# Patient Record
Sex: Female | Born: 1980 | Hispanic: No | Marital: Single | Smoking: Former smoker
Health system: Southern US, Community
[De-identification: ages and names within clinical notes are randomized; demographics above are authoritative.]

## PROBLEM LIST (undated history)

## (undated) DIAGNOSIS — T4145XA Adverse effect of unspecified anesthetic, initial encounter: Secondary | ICD-10-CM

## (undated) DIAGNOSIS — R51 Headache: Secondary | ICD-10-CM

## (undated) DIAGNOSIS — Z789 Other specified health status: Secondary | ICD-10-CM

## (undated) DIAGNOSIS — T8859XA Other complications of anesthesia, initial encounter: Secondary | ICD-10-CM

## (undated) HISTORY — DX: Other specified health status: Z78.9

## (undated) HISTORY — PX: NO PAST SURGERIES: SHX2092

## (undated) HISTORY — PX: WISDOM TOOTH EXTRACTION: SHX21

---

## 2011-12-22 ENCOUNTER — Inpatient Hospital Stay (HOSPITAL_COMMUNITY)
Admission: AD | Admit: 2011-12-22 | Discharge: 2011-12-22 | Disposition: A | Discharge status: Home / Self Care | Payer: Self-pay | Source: Ambulatory Visit | Attending: Obstetrics & Gynecology | Admitting: Obstetrics & Gynecology

## 2011-12-22 ENCOUNTER — Encounter (HOSPITAL_COMMUNITY): Payer: Self-pay | Admitting: *Deleted

## 2011-12-22 DIAGNOSIS — B9689 Other specified bacterial agents as the cause of diseases classified elsewhere: Secondary | ICD-10-CM | POA: Insufficient documentation

## 2011-12-22 DIAGNOSIS — A499 Bacterial infection, unspecified: Secondary | ICD-10-CM

## 2011-12-22 DIAGNOSIS — N760 Acute vaginitis: Secondary | ICD-10-CM | POA: Insufficient documentation

## 2011-12-22 DIAGNOSIS — N949 Unspecified condition associated with female genital organs and menstrual cycle: Secondary | ICD-10-CM | POA: Insufficient documentation

## 2011-12-22 HISTORY — DX: Adverse effect of unspecified anesthetic, initial encounter: T41.45XA

## 2011-12-22 HISTORY — DX: Headache: R51

## 2011-12-22 HISTORY — DX: Other complications of anesthesia, initial encounter: T88.59XA

## 2011-12-22 LAB — WET PREP, GENITAL: Trich, Wet Prep: NONE SEEN

## 2011-12-22 LAB — URINALYSIS, ROUTINE W REFLEX MICROSCOPIC
Bilirubin Urine: NEGATIVE
Ketones, ur: NEGATIVE mg/dL
Leukocytes, UA: NEGATIVE
Nitrite: NEGATIVE
Protein, ur: NEGATIVE mg/dL
Urobilinogen, UA: 0.2 mg/dL (ref 0.0–1.0)

## 2011-12-22 MED ORDER — METRONIDAZOLE 500 MG PO TABS: 500.0000 mg | ORAL_TABLET | Freq: Two times a day (BID) | ORAL | Status: DC

## 2011-12-22 NOTE — MAU Note (Signed)
Patient states she has had sharp shooting pains in the vagina for a couple of months.

## 2011-12-22 NOTE — MAU Provider Note (Signed)
History    CSN: [id number]  Arrival date and time: 12/22/11 1229   First Provider Initiated Contact with Patient 12/22/11 1337     Chief Complaint  Patient presents with  . Vaginal Pain   HPI  The patient is a 31 y.o. AAF presenting with intermittent sharp pain in her vagino-rectal area. It occurs 3-4 times a week for the past 2 months. There is no identifiable trigger of the pain. Denies N/V, change in BM, fever, abdominal pain, dysuria, hematuria. She has one sexual partner and has a copper IUD in place for the past 3 years. She has irregular menses, LMP 11/17/11.    Past Medical History  Diagnosis Date  . Headache   . Depression     No meds. or  tx  . Complication of anesthesia     Epidurals did not work w/labor   Past Surgical History  Procedure Date  . Vaginal delivery     X 5   History reviewed. No pertinent family history.  History  Substance Use Topics  . Smoking status: Former Smoker    Quit date: 12/22/1999  . Smokeless tobacco: Never Used  . Alcohol Use: Yes     Comment: occas. glass of wine   Allergies: No Known Allergies  Prescriptions prior to admission  Medication Sig Dispense Refill  . Copper IUD 1 each by Intrauterine route continuous.       Review of Systems  Constitutional: Negative for fever.  Gastrointestinal: Negative for nausea, vomiting and abdominal pain.  Genitourinary: Negative for dysuria and hematuria.   Physical Exam   Blood pressure 105/65, pulse 86, temperature 98.1 F (36.7 C), temperature source Oral, resp. rate 16, height 5\' 7"  (1.702 m), weight 83.462 kg (184 lb), SpO2 98.00%.  Physical Exam  Constitutional: She appears well-developed and well-nourished.  Cardiovascular: Normal rate, regular rhythm, normal heart sounds and intact distal pulses.   GI: She exhibits no distension. There is no tenderness.  Genitourinary: Rectal exam shows no external hemorrhoid. Uterus is not enlarged and not tender. Cervix exhibits no motion  tenderness and no discharge. Right adnexum displays no tenderness. Left adnexum displays no tenderness. Vaginal discharge found.       IUD strings visualized   Vaginal discharge was positive for odor, copious amount white, thin  discharge Results for orders placed during the hospital encounter of 12/22/11 (from the past 24 hour(s))  URINALYSIS, ROUTINE W REFLEX MICROSCOPIC     Status: Normal   Collection Time   12/22/11  1:10 PM      Component Value Range   Color, Urine YELLOW  YELLOW   APPearance CLEAR  CLEAR   Specific Gravity, Urine 1.015  1.005 - 1.030   pH 8.0  5.0 - 8.0   Glucose, UA NEGATIVE  NEGATIVE mg/dL   Hgb urine dipstick NEGATIVE  NEGATIVE   Bilirubin Urine NEGATIVE  NEGATIVE   Ketones, ur NEGATIVE  NEGATIVE mg/dL   Protein, ur NEGATIVE  NEGATIVE mg/dL   Urobilinogen, UA 0.2  0.0 - 1.0 mg/dL   Nitrite NEGATIVE  NEGATIVE   Leukocytes, UA NEGATIVE  NEGATIVE  POCT PREGNANCY, URINE     Status: Normal   Collection Time   12/22/11  1:27 PM      Component Value Range   Preg Test, Ur NEGATIVE  NEGATIVE  WET PREP, GENITAL     Status: Abnormal   Collection Time   12/22/11  2:03 PM      Component Value Range  Yeast Wet Prep HPF POC NONE SEEN  NONE SEEN   Trich, Wet Prep NONE SEEN  NONE SEEN   Clue Cells Wet Prep HPF POC MODERATE (*) NONE SEEN   WBC, Wet Prep HPF POC MODERATE (*) NONE SEEN   MAU Course  Procedures GcChl swab sent to lab   MDM  Assessment and Plan  A: Bacterial vaginosis confirmed by wet prep  P: Treat with metronidazole 500 mg bid for 7 days. Instructed to avoid alcohol and sexual intercourse until treatment complete. F/U with PCP if symptoms do not resolve after treatment.    Corky Downs 12/22/2011, 1:51 PM   I have reviewed the HPI with the student, observed her exam, reviewed labs and plan of care and agree with her findings

## 2011-12-23 LAB — GC/CHLAMYDIA PROBE AMP, GENITAL
Chlamydia, DNA Probe: NEGATIVE
GC Probe Amp, Genital: NEGATIVE

## 2012-05-31 ENCOUNTER — Inpatient Hospital Stay (HOSPITAL_COMMUNITY)
Admission: AD | Admit: 2012-05-31 | Discharge: 2012-05-31 | Disposition: A | Discharge status: Home / Self Care | Payer: Self-pay | Source: Ambulatory Visit | Attending: Obstetrics & Gynecology | Admitting: Obstetrics & Gynecology

## 2012-05-31 ENCOUNTER — Encounter (HOSPITAL_COMMUNITY): Payer: Self-pay | Admitting: *Deleted

## 2012-05-31 DIAGNOSIS — A499 Bacterial infection, unspecified: Secondary | ICD-10-CM | POA: Insufficient documentation

## 2012-05-31 DIAGNOSIS — B9689 Other specified bacterial agents as the cause of diseases classified elsewhere: Secondary | ICD-10-CM | POA: Insufficient documentation

## 2012-05-31 DIAGNOSIS — Z30431 Encounter for routine checking of intrauterine contraceptive device: Secondary | ICD-10-CM | POA: Insufficient documentation

## 2012-05-31 DIAGNOSIS — N949 Unspecified condition associated with female genital organs and menstrual cycle: Secondary | ICD-10-CM | POA: Insufficient documentation

## 2012-05-31 DIAGNOSIS — R109 Unspecified abdominal pain: Secondary | ICD-10-CM | POA: Insufficient documentation

## 2012-05-31 DIAGNOSIS — N760 Acute vaginitis: Secondary | ICD-10-CM | POA: Insufficient documentation

## 2012-05-31 LAB — URINALYSIS, ROUTINE W REFLEX MICROSCOPIC
Bilirubin Urine: NEGATIVE
Leukocytes, UA: NEGATIVE
Nitrite: NEGATIVE
Specific Gravity, Urine: 1.015 (ref 1.005–1.030)
Urobilinogen, UA: 0.2 mg/dL (ref 0.0–1.0)
pH: 6 (ref 5.0–8.0)

## 2012-05-31 LAB — WET PREP, GENITAL

## 2012-05-31 LAB — URINE MICROSCOPIC-ADD ON

## 2012-05-31 MED ORDER — METRONIDAZOLE 500 MG PO TABS: 500.0000 mg | ORAL_TABLET | Freq: Three times a day (TID) | ORAL | Status: DC

## 2012-05-31 NOTE — MAU Note (Addendum)
I was in store and had sharp pain in lower abdomen. Lasted about . Was sharp pain. Hurt to sit and was hard to move. No bleeding or d/c. Pt states has IUD which was placed 61yrs ago in [place]

## 2012-05-31 NOTE — MAU Provider Note (Signed)
Attestation of Attending Supervision of Advanced Practitioner (PA/CNM/NP): Evaluation and management procedures were performed by the Advanced Practitioner under my supervision and collaboration.  I have reviewed the Advanced Practitioner's note and chart, and I agree with the management and plan.  Shatera Rennert, MD, FACOG Attending Obstetrician & Gynecologist Faculty Practice, [facility]  

## 2012-05-31 NOTE — MAU Provider Note (Signed)
CC: Abdominal Pain and Vaginal Pain    First Provider Initiated Contact with Patient 05/31/12 1755      HPI Rachael Jones is a 32 y.o. [id number]  who presents with report of an episode of sharp vaginal pain that felt like a contraction in her vagina at 11:00 today lasting 10 minutes. It has not recurred. She has not had intercourse for a week but when she did she had postcoital spotting about 8 days ago. She is in a mutually monogamous relationship. Has an IUD that has been in 3 years and she thinks her pain may be related to that. She had one similar episode of pain which was less severe occurring about 6 months ago. LMP 05/15/2012. Denies abnormal bleeding except as noted above.  ROS: Denies irritative vaginal discharge frequency or urgency of urination, hematuria, back pain, fever, nausea vomiting diarrhea  Past Medical History  Diagnosis Date  . Headache   . Complication of anesthesia     Epidurals did not work w/labor    OB History   Grav Para Term Preterm Abortions TAB SAB Ect Mult Living   5 5 5       5      # Outc Date GA Lbr Len/2nd Wgt Sex Del Anes PTL Lv   1 TRM            2 TRM            3 TRM            4 TRM            5 TRM               Past Surgical History  Procedure Laterality Date  . Vaginal delivery      X 5  . No past surgeries      History   Social History  . Marital Status: Single    Spouse Name: N/A    Number of Children: N/A  . Years of Education: N/A   Occupational History  . Not on file.   Social History Main Topics  . Smoking status: Former Smoker    Quit date: 12/22/1999  . Smokeless tobacco: Never Used  . Alcohol Use: Yes     Comment: occas. glass of wine  . Drug Use: No  . Sexually Active: Yes    Birth Control/ Protection: IUD   Other Topics Concern  . Not on file   Social History Narrative  . No narrative on file    No current facility-administered medications on file prior to encounter.   Current Outpatient  Prescriptions on File Prior to Encounter  Medication Sig Dispense Refill  . Copper IUD 1 each by Intrauterine route continuous.        No Known Allergies  PHYSICAL EXAM Filed Vitals:   05/31/12 1614  BP: 92/51  Pulse: 68  Temp: 98.1 F (36.7 C)  Resp: 18   General: Well nourished, well developed female in no acute distress Cardiovascular: Normal rate Respiratory: Normal effort Abdomen: Soft, nontender Back: No CVAT Extremities: No edema Neurologic: Alert and oriented Speculum exam: NEFG; no discomfort with insertion of speculum, vagina with gray frothy, no active blood; cervix friable with swabbing Bimanual exam: cervix closed, no CMT; uterus NSSP; no adnexal tenderness or masses  LAB RESULTS Results for orders placed during the hospital encounter of 05/31/12 (from the past 24 hour(s))  URINALYSIS, ROUTINE W REFLEX MICROSCOPIC     Status: Abnormal   Collection  Time    05/31/12  4:20 PM      Result Value Range   Color, Urine YELLOW  YELLOW   APPearance CLEAR  CLEAR   Specific Gravity, Urine 1.015  1.005 - 1.030   pH 6.0  5.0 - 8.0   Glucose, UA NEGATIVE  NEGATIVE mg/dL   Hgb urine dipstick TRACE (*) NEGATIVE   Bilirubin Urine NEGATIVE  NEGATIVE   Ketones, ur NEGATIVE  NEGATIVE mg/dL   Protein, ur NEGATIVE  NEGATIVE mg/dL   Urobilinogen, UA 0.2  0.0 - 1.0 mg/dL   Nitrite NEGATIVE  NEGATIVE   Leukocytes, UA NEGATIVE  NEGATIVE  URINE MICROSCOPIC-ADD ON     Status: None   Collection Time    05/31/12  4:20 PM      Result Value Range   Squamous Epithelial / LPF RARE  RARE   WBC, UA 0-2  <3 WBC/hpf   RBC / HPF 0-2  <3 RBC/hpf  POCT PREGNANCY, URINE     Status: None   Collection Time    05/31/12  4:42 PM      Result Value Range   Preg Test, Ur NEGATIVE  NEGATIVE  WET PREP, GENITAL     Status: Abnormal   Collection Time    05/31/12  6:10 PM      Result Value Range   Yeast Wet Prep HPF POC NONE SEEN  NONE SEEN   Trich, Wet Prep NONE SEEN  NONE SEEN   Clue Cells Wet  Prep HPF POC MODERATE (*) NONE SEEN   WBC, Wet Prep HPF POC FEW (*) NONE SEEN    ASSESSMENT  1. BV (bacterial vaginosis)     PLAN Discharge home. See AVS for patient education.    Medication List    TAKE these medications       Copper Iud  1 each by Intrauterine route continuous.     metroNIDAZOLE 500 MG tablet  Commonly known as:  FLAGYL  Take 1 tablet (500 mg total) by mouth 3 (three) times daily.       Follow-up Information   Follow up with [facility] In 2 weeks. (Someone from GYN clinic will call you with an appointment)    Contact information:   [address] [id number] [place] [address] [phone]       Danae Orleans, CNM 05/31/2012 6:07 PM

## 2012-06-01 LAB — GC/CHLAMYDIA PROBE AMP: GC Probe RNA: NEGATIVE

## 2012-06-30 ENCOUNTER — Encounter: Payer: Self-pay | Admitting: Medical

## 2012-07-13 ENCOUNTER — Inpatient Hospital Stay (HOSPITAL_COMMUNITY)
Admission: AD | Admit: 2012-07-13 | Discharge: 2012-07-13 | Disposition: A | Discharge status: Home / Self Care | Payer: Self-pay | Source: Ambulatory Visit | Attending: Obstetrics & Gynecology | Admitting: Obstetrics & Gynecology

## 2012-07-13 ENCOUNTER — Other Ambulatory Visit: Payer: Self-pay

## 2012-07-13 ENCOUNTER — Encounter (HOSPITAL_COMMUNITY): Payer: Self-pay | Admitting: *Deleted

## 2012-07-13 ENCOUNTER — Inpatient Hospital Stay (HOSPITAL_COMMUNITY): Payer: Self-pay

## 2012-07-13 DIAGNOSIS — S29011A Strain of muscle and tendon of front wall of thorax, initial encounter: Secondary | ICD-10-CM

## 2012-07-13 DIAGNOSIS — R079 Chest pain, unspecified: Secondary | ICD-10-CM | POA: Insufficient documentation

## 2012-07-13 MED ORDER — IBUPROFEN 600 MG PO TABS: 600.0000 mg | ORAL_TABLET | Freq: Four times a day (QID) | ORAL | Status: DC | PRN

## 2012-07-13 NOTE — Progress Notes (Signed)
Patient states she drinks "5 hour energy". States she usually drinks 5 per week.

## 2012-07-13 NOTE — MAU Note (Signed)
Patient states that when she takes a deep breath, she has pain over the left breast in the chest wall. Pain has been there for a few weeks, comes and goes only with deep breaths.

## 2012-07-13 NOTE — MAU Provider Note (Signed)
History     CSN: [id number]  Arrival date and time: 07/13/12 1318   First Provider Initiated Contact with Patient 07/13/12 1409      Chief Complaint  Patient presents with  . Chest Pain   HPI Ms. Rachael Jones is a 32 y.o. [id number] presents to MAU today with chest pain on the upper left side x 2-3 weeks. The patient states that the pain comes and goes. It is most prominent on deep inspiration. The patient denies numbness, tingling or paralysis of her upper extremities. She denies abdominal pain, heartburn, dizziness, weakness, LOC, recent URI or allergic rhinitis symptoms, fever or SOB. She states some DOE, but this is not a new problem.   OB History   Grav Para Term Preterm Abortions TAB SAB Ect Mult Living   5 5 5       5       Past Medical History  Diagnosis Date  . Headache(784.0)   . Complication of anesthesia     Epidurals did not work w/labor    Past Surgical History  Procedure Laterality Date  . Vaginal delivery      X 5  . No past surgeries    . Wisdom tooth extraction      History reviewed. No pertinent family history.  History  Substance Use Topics  . Smoking status: Former Smoker    Quit date: 12/22/1999  . Smokeless tobacco: Never Used  . Alcohol Use: Yes     Comment: occas. glass of wine    Allergies: No Known Allergies  No prescriptions prior to admission    Review of Systems  Constitutional: Negative for fever and malaise/fatigue.  HENT: Negative for congestion and sore throat.   Respiratory: Negative for cough, sputum production, shortness of breath and wheezing.   Cardiovascular: Positive for chest pain. Negative for palpitations and leg swelling.  Gastrointestinal: Negative for heartburn, nausea, vomiting and abdominal pain.  Neurological: Negative for dizziness.   Physical Exam   Blood pressure 113/67, pulse 91, resp. rate 18, height 5' 6.5" (1.689 m), weight 205 lb 3.2 oz (93.078 kg), last menstrual period 06/19/2012, SpO2  99.00%.  Physical Exam  Constitutional: She is oriented to person, place, and time. She appears well-developed and well-nourished. No distress.  HENT:  Head: Normocephalic and atraumatic.  Cardiovascular: Normal rate, regular rhythm and normal heart sounds.   Respiratory: Effort normal and breath sounds normal. No respiratory distress.  GI: Soft. Bowel sounds are normal. She exhibits no distension and no mass. There is no tenderness. There is no rebound and no guarding.  Musculoskeletal: Normal range of motion. She exhibits no edema.       Arms: Neurological: She is alert and oriented to person, place, and time.  Skin: Skin is warm and dry. No erythema.  Psychiatric: She has a normal mood and affect.   Dg Chest 2 View  07/13/2012   *RADIOLOGY REPORT*  Clinical Data: Chest pain with inhalation.  CHEST - 2 VIEW  Comparison: None.  Findings: The heart size is normal.  The lungs are clear.  There is no evidence for pneumothorax.  The visualized soft tissues and bony thorax are unremarkable.  IMPRESSION: Negative two-view chest.   Original Report Authenticated By: Marin Roberts, M.D.     MAU Course  Procedures None  MDM EKG, Chest Xray EKG - normal sinus rhythm Chest xray - Normal study Assessment and Plan  A: Muscle strain, chest wall  P: Discharge home Rx for Ibuprofen sent to patient's  pharmacy Patient recommended to rest and ice area List of area providers on AVS. Patient encouraged to establish care with PCP for follow-up Follow-up with PCP or urgent care if symptoms persist or worsen Patient may return to MAU as needed  Freddi Starr, PA-C  07/13/2012, 4:22 PM

## 2012-07-13 NOTE — MAU Provider Note (Signed)
Attestation of Attending Supervision of Advanced Practitioner (CNM/NP): Evaluation and management procedures were performed by the Advanced Practitioner under my supervision and collaboration.  I have reviewed the Advanced Practitioner's note and chart, and I agree with the management and plan.  HARRAWAY-SMITH, Husna Krone 4:57 PM     

## 2012-12-22 ENCOUNTER — Encounter (HOSPITAL_COMMUNITY): Payer: Self-pay | Admitting: Family

## 2012-12-22 ENCOUNTER — Inpatient Hospital Stay (HOSPITAL_COMMUNITY)
Admission: AD | Admit: 2012-12-22 | Discharge: 2012-12-22 | Disposition: A | Discharge status: Home / Self Care | Payer: Self-pay | Source: Ambulatory Visit | Attending: Obstetrics & Gynecology | Admitting: Obstetrics & Gynecology

## 2012-12-22 DIAGNOSIS — L239 Allergic contact dermatitis, unspecified cause: Secondary | ICD-10-CM

## 2012-12-22 DIAGNOSIS — L509 Urticaria, unspecified: Secondary | ICD-10-CM | POA: Insufficient documentation

## 2012-12-22 DIAGNOSIS — L259 Unspecified contact dermatitis, unspecified cause: Secondary | ICD-10-CM | POA: Insufficient documentation

## 2012-12-22 DIAGNOSIS — Z30431 Encounter for routine checking of intrauterine contraceptive device: Secondary | ICD-10-CM | POA: Insufficient documentation

## 2012-12-22 DIAGNOSIS — R109 Unspecified abdominal pain: Secondary | ICD-10-CM | POA: Insufficient documentation

## 2012-12-22 DIAGNOSIS — K219 Gastro-esophageal reflux disease without esophagitis: Secondary | ICD-10-CM

## 2012-12-22 DIAGNOSIS — N949 Unspecified condition associated with female genital organs and menstrual cycle: Secondary | ICD-10-CM | POA: Insufficient documentation

## 2012-12-22 DIAGNOSIS — N938 Other specified abnormal uterine and vaginal bleeding: Secondary | ICD-10-CM

## 2012-12-22 DIAGNOSIS — K2970 Gastritis, unspecified, without bleeding: Secondary | ICD-10-CM | POA: Insufficient documentation

## 2012-12-22 LAB — COMPREHENSIVE METABOLIC PANEL
AST: 25 U/L (ref 0–37)
CO2: 29 mEq/L (ref 19–32)
Chloride: 101 mEq/L (ref 96–112)
Creatinine, Ser: 0.91 mg/dL (ref 0.50–1.10)
GFR calc Af Amer: >90 mL/min (ref >90)
GFR calc non Af Amer: 83 mL/min — ABNORMAL LOW (ref >90)
Potassium: 3.5 mEq/L (ref 3.5–5.1)
Total Bilirubin: 0.3 mg/dL (ref 0.3–1.2)
Total Protein: 7.5 g/dL (ref 6.0–8.3)

## 2012-12-22 LAB — CBC
HCT: 37.2 % (ref 36.0–46.0)
MCV: 81.9 fL (ref 78.0–100.0)
Platelets: 329 10*3/uL (ref 150–400)
RBC: 4.54 MIL/uL (ref 3.87–5.11)
WBC: 8.8 10*3/uL (ref 4.0–10.5)

## 2012-12-22 LAB — WET PREP, GENITAL

## 2012-12-22 LAB — URINALYSIS, ROUTINE W REFLEX MICROSCOPIC
Bilirubin Urine: NEGATIVE
Ketones, ur: NEGATIVE mg/dL
Leukocytes, UA: NEGATIVE
Nitrite: NEGATIVE
Specific Gravity, Urine: >1.03 — ABNORMAL HIGH (ref 1.005–1.030)
Urobilinogen, UA: 0.2 mg/dL (ref 0.0–1.0)

## 2012-12-22 LAB — URINE MICROSCOPIC-ADD ON

## 2012-12-22 MED ORDER — FAMOTIDINE 20 MG PO TABS: 20.0000 mg | ORAL_TABLET | Freq: Two times a day (BID) | ORAL | Status: DC

## 2012-12-22 MED ORDER — DIPHENHYDRAMINE HCL 25 MG PO CAPS
25.0000 mg | ORAL_CAPSULE | Freq: Once | ORAL | Status: AC
  Administered 2012-12-22: 25 mg via ORAL
  Filled 2012-12-22: qty 1

## 2012-12-22 MED ORDER — GI COCKTAIL ~~LOC~~
30.0000 mL | Freq: Once | ORAL | Status: AC
  Administered 2012-12-22: 30 mL via ORAL
  Filled 2012-12-22: qty 30

## 2012-12-22 MED ORDER — CETIRIZINE-PSEUDOEPHEDRINE ER 5-120 MG PO TB12: 1.0000 | ORAL_TABLET | Freq: Every day | ORAL | Status: DC

## 2012-12-22 NOTE — MAU Note (Signed)
Pt reports she has been having vaginal bleeding x 1 month. Also c/o hives that started last night. Very itchy.

## 2012-12-22 NOTE — MAU Provider Note (Signed)
History of Present Illness   Patient Identification Rachael Jones is a 32 y.o. female.  Patient information was obtained from patient. History/Exam limitations: none. Patient presented to the MAU by private vehicle.  Chief Complaint  Vaginal Bleeding and Urticaria  Rachael Jones is a 45 yof [id number] who presents today c/o 1day hx of urticaria, 2day hx of abd pain and 46month hx of vaginal bleeding.  Pt states that yesterday at appx 1730 hrs she noticed pruritis of her scalp which soon moved to her lower back.  She states that through the night last night she noticed the pruritis move, and today presents with pruritic urticaria on her extremities.  She denies having known allergies, denies ingestion of any new/unusual foods/medication and denies the use of any new products.  She states she has not taken any medication to help the pruritis.  Rachael Jones also states that for approximately 2 days, she has noticed an intermittent chest discomfort which she describes as a "heaviness" and "as if food is caught" after she eats.  She states that the discomfort has since moved into her upper abd, and she references her epigastric region for where the pain is today.  She also reports minor palpitations and denies SOB.  She denies nausea, vomiting, diarrhea, hx of gastric ulcers, or gallstones.  Rachael Jones also states that she is concerned about a vaginal bleed x1 month.  She states that she typically has menses that last 7 days and are occasionally heavy, however this month the bleeding continued past 7 days and has been occurring for appx 4 wks.  She states she had an Paragard IUD placed x73yrs ago, and has not since had the placement evaluated.     Past Medical History  Diagnosis Date  . Headache(784.0)   . Complication of anesthesia     Epidurals did not work w/labor   No family history on file. Scheduled Meds: Continuous Infusions: PRN Meds:    No Known Allergies History   Social History  . Marital Status:  Single    Spouse Name: N/A    Number of Children: N/A  . Years of Education: N/A   Occupational History  . Not on file.   Social History Main Topics  . Smoking status: Former Smoker    Quit date: 12/22/1999  . Smokeless tobacco: Never Used  . Alcohol Use: Yes     Comment: occas. glass of wine  . Drug Use: No  . Sexual Activity: Yes    Birth Control/ Protection: IUD   Other Topics Concern  . Not on file   Social History Narrative  . No narrative on file   Review of Systems Pertinent items are noted in HPI.   Physical Exam   BP 129/49  Pulse 99  Temp(Src) 98.3 F (36.8 C) (Oral)  Resp 18  Ht 5\' 7"  (1.702 m)  Wt 209 lb 6.4 oz (94.983 kg)  BMI 32.79 kg/m2  LMP 11/22/2012 General:   alert, cooperative and no distress  Heart: regular rate and rhythm, S1, S2 normal, no murmur, click, rub or gallop  Lungs: clear to auscultation bilaterally  Abdomen: soft, non-tender, without masses or organomegaly, normal bowel sounds and Murphy's sign negative  Pelvic:    Vulva: normal   Vagina:  normal mucosa, scant blood   Cervix: multiparous appearance, no cervical motion tenderness and Minor amount of bloody discharge IUD strings are visible and appear appropriate length   Uterus: normal size, non-tender, normal shape and consistency  Adnexa: normal adnexa  SKIN: warm, dry with diffuse urticaria on the back and arms. Obvious excoriations noted. No papules or drainage noted.  Assessment/Plan   1. R/O Cardiac abnormalities 2. Gastritis 3. Urticaria of unknown etiology   Studies: Lab: CBC, CMP, Urine Preg test and Urinalysis  Initial Electrocardiogram, Chlamydia/Gonorrhea culture, vaginal wet prep Treatments: GI Cocktail; PO Benadryl 25mg   Pending results of EKG  MDM Patient denies throat tightness, SOB or swelling of the lips or tongue GI cocktail given in MAU - patient reports resolution of symptoms Benadryl given - patient continues to have some itching EKG - NSR CBC,  CMP - WNL  A: GERD Allergic dermatitis AUB secondary to paragard IUD  P: Discharge home Rx for Pepcid and Zyrtec sent to patient's pharmacy Patient advised to Benadryl can also be taken at night Discussed symptoms concerning for anaphylaxis and need for emergency medical attention if those arrise Patient referred to [facility] clinic for follow-up to discuss other birth control options Patient may return to MAU as needed or if her condition were to change or worsen  I have seen and evaluated the patient with the PA student. I agree with the assessment and plan as written above.   Freddi Starr, PA-C 12/22/2012 11:51 AM

## 2012-12-22 NOTE — MAU Note (Addendum)
32 yo, G5P5 with LMP of 10/21, presents to MAU with c/o hives starting at 1730 yesterday, throughout until midnight. Denies allergies/asthma/contact with new foods, meds, soaps, lotions.  Did not take anything for it. Denies nasal congestion or airway compromise. Reports gnawing epigastric pain since yesterday.  VB since period 10/21. IUD x 3 years.

## 2012-12-25 NOTE — MAU Provider Note (Signed)
Attestation of Attending Supervision of Advanced Practitioner (CNM/NP): Evaluation and management procedures were performed by the Advanced Practitioner under my supervision and collaboration. I have reviewed the Advanced Practitioner's note and chart, and I agree with the management and plan.  LEGGETT,KELLY H. 4:46 PM   

## 2013-03-01 ENCOUNTER — Encounter: Payer: Self-pay | Admitting: Family Medicine

## 2013-03-01 ENCOUNTER — Ambulatory Visit (INDEPENDENT_AMBULATORY_CARE_PROVIDER_SITE_OTHER): Payer: Medicaid Other | Admitting: Family Medicine

## 2013-03-01 VITALS — BP 101/65 | HR 72 | Temp 97.2°F | Ht 67.0 in | Wt 201.2 lb

## 2013-03-01 DIAGNOSIS — Z30432 Encounter for removal of intrauterine contraceptive device: Secondary | ICD-10-CM

## 2013-03-01 DIAGNOSIS — Z3009 Encounter for other general counseling and advice on contraception: Secondary | ICD-10-CM

## 2013-03-01 DIAGNOSIS — Z30017 Encounter for initial prescription of implantable subdermal contraceptive: Secondary | ICD-10-CM

## 2013-03-01 MED ORDER — ETONOGESTREL 68 MG ~~LOC~~ IMPL
68.0000 mg | DRUG_IMPLANT | Freq: Once | SUBCUTANEOUS | Status: AC
  Administered 2013-03-01: 68 mg via SUBCUTANEOUS

## 2013-03-01 MED ORDER — ETONOGESTREL 68 MG ~~LOC~~ IMPL: 1.0000 | DRUG_IMPLANT | Freq: Once | SUBCUTANEOUS | Status: DC

## 2013-03-01 NOTE — Patient Instructions (Signed)
Etonogestrel implant What is this medicine? ETONOGESTREL (et oh noe JES trel) is a contraceptive (birth control) device. It is used to prevent pregnancy. It can be used for up to 3 years. This medicine may be used for other purposes; ask your health care provider or pharmacist if you have questions. COMMON BRAND NAME(S): Implanon, Nexplanon  What should I tell my health care provider before I take this medicine? They need to know if you have any of these conditions: -abnormal vaginal bleeding -blood vessel disease or blood clots -cancer of the breast, cervix, or liver -depression -diabetes -gallbladder disease -headaches -heart disease or recent heart attack -high blood pressure -high cholesterol -kidney disease -liver disease -renal disease -seizures -tobacco smoker -an unusual or allergic reaction to etonogestrel, other hormones, anesthetics or antiseptics, medicines, foods, dyes, or preservatives -pregnant or trying to get pregnant -breast-feeding How should I use this medicine? This device is inserted just under the skin on the inner side of your upper arm by a health care professional. Talk to your pediatrician regarding the use of this medicine in children. Special care may be needed. Overdosage: If you think you've taken too much of this medicine contact a poison control center or emergency room at once. Overdosage: If you think you have taken too much of this medicine contact a poison control center or emergency room at once. NOTE: This medicine is only for you. Do not share this medicine with others. What if I miss a dose? This does not apply. What may interact with this medicine? Do not take this medicine with any of the following medications: -amprenavir -bosentan -fosamprenavir This medicine may also interact with the following medications: -barbiturate medicines for inducing sleep or treating seizures -certain medicines for fungal infections like ketoconazole and  itraconazole -griseofulvin -medicines to treat seizures like carbamazepine, felbamate, oxcarbazepine, phenytoin, topiramate -modafinil -phenylbutazone -rifampin -some medicines to treat HIV infection like atazanavir, indinavir, lopinavir, nelfinavir, tipranavir, ritonavir -St. John's wort This list may not describe all possible interactions. Give your health care provider a list of all the medicines, herbs, non-prescription drugs, or dietary supplements you use. Also tell them if you smoke, drink alcohol, or use illegal drugs. Some items may interact with your medicine. What should I watch for while using this medicine? This product does not protect you against HIV infection (AIDS) or other sexually transmitted diseases. You should be able to feel the implant by pressing your fingertips over the skin where it was inserted. Tell your doctor if you cannot feel the implant. What side effects may I notice from receiving this medicine? Side effects that you should report to your doctor or health care professional as soon as possible: -allergic reactions like skin rash, itching or hives, swelling of the face, lips, or tongue -breast lumps -changes in vision -confusion, trouble speaking or understanding -dark urine -depressed mood -general ill feeling or flu-like symptoms -light-colored stools -loss of appetite, nausea -right upper belly pain -severe headaches -severe pain, swelling, or tenderness in the abdomen -shortness of breath, chest pain, swelling in a leg -signs of pregnancy -sudden numbness or weakness of the face, arm or leg -trouble walking, dizziness, loss of balance or coordination -unusual vaginal bleeding, discharge -unusually weak or tired -yellowing of the eyes or skin Side effects that usually do not require medical attention (Report these to your doctor or health care professional if they continue or are bothersome.): -acne -breast pain -changes in  weight -cough -fever or chills -headache -irregular menstrual bleeding -itching, burning,   and vaginal discharge -pain or difficulty passing urine -sore throat This list may not describe all possible side effects. Call your doctor for medical advice about side effects. You may report side effects to FDA at 1-800-FDA-1088. Where should I keep my medicine? This drug is given in a hospital or clinic and will not be stored at home. NOTE: This sheet is a summary. It may not cover all possible information. If you have questions about this medicine, talk to your doctor, pharmacist, or health care provider.  2014, [employer]vier/Gold Standard. (2011-07-27 15:37:45)  

## 2013-03-01 NOTE — Progress Notes (Signed)
Pt with complaints for vaginal pain, pt has had IUD for 5074yrs and would like alternative birth control. Pt desires long term birth control. Discussed options and agree with nexplanon placement. Removed IUD and placed nexplanon as below. No acute issues. Pt to follow up PRN or if symptoms worsen. Discussed irregular bleeding up to 6months and to return for reevaluation.   Complains of occassional vaginal pain, pt has been tx several times for BV. No obvious etiology. Recommended lubrication during intercourse. No obvious etiology with speculum placement. Will trial alternative birth control, and may reevaluate if not improved.  IUD removal Procedure: Removal of Intrauterine Device Consent: The risks and benefits of the procedure were discussed with the patient. Risks include acute and (rarely) chronic pain, infection, bleeding (internal and external), dysregulation menses, and need for subsequent procedure to correct a complication. Rarely, damage to internal structures may occur requiring subsequent procedures. Benefits may include renewed fertility. Patient aware that additional form of contraception should be used as indicated. The alternatives were explained to the patient including: not doing procedure or postponing procedure. It is recommended that Mirena IUDs be removed by five years and ParaGard IUDs by ten years as they may be ineffective and cause subsequent side effects.  The written consent form has been signed and placed in the patient's medical record The patient voiced understanding of the procedure and agreed to proceed. Indication: Patient no longer desires to have IUD or needs IUD replaced Physicians: Dr. Jolyn LentMichael Deisha Stull Description in detail: A timeout was completed before the start of the procedure. The speculum was inserted and the cervix was identified.  The IUD strings were identified and grasped with a Kelly clamp and the IUD was removed.  The patient tolerated the procedure well.  No  complications. EBL: Minimal; <11mL Complications: None Instructions to patient: Return to clinic vs. ER, depending on severity, with fevers, vaginal bleeding other than mild spotting, or increasing pain. Expect mild soreness for one day. Menses may be irregular or heavy for three months or longer. Call clinic with other questions.  Procedure: Insertion of Nexplanon Consent: The risks and benefits of the procedure were discussed with the patient. The alternatives were explained to the patient. The disadvantages to not doing the procedure were discussed with the patent. The written consent form has been signed and placed in the patient's medical record The patient voiced understanding of the procedure and agreed to proceed. Indication: Contraception Description in detail: A timeout was completed before the start of the procedure - the site was verified and documented in the chart. IUD removed immediately prior to placement The inner L arm was cleansed with alcohol and anesthetized with 2cc Lidocaine.  The Nexplanon introducer was placed in the appropriate location and inserted in the usual fashion. The patient tolerated the procedure well, without any S/S of vasovagal responses. EBL: Minimal, <665ml Complications: None Instructions to patient: Return to clinic vs. ER, depending on severity, with fevers/chills/excessive bleeding from surgical site.  Keep pressure dressing in place for 24 hours.  Expect mild soreness for one day. Call clinic with other questions. Allow approximately two weeks before having unprotected intercourse; the Nexplanon does not protect against STIs. Replace in 3 years. Follow up plan: Return to clinic for follow up as needed.

## 2013-03-02 ENCOUNTER — Encounter: Payer: Self-pay | Admitting: *Deleted

## 2013-06-02 ENCOUNTER — Telehealth: Payer: Self-pay | Admitting: *Deleted

## 2013-06-02 NOTE — Telephone Encounter (Signed)
Patient left message that she is having pain caused by her nexplanon. She had it placed in February.

## 2013-06-02 NOTE — Telephone Encounter (Signed)
Spoke with patient about her symptoms. She stated that she has some pain in her bicep and her forearm. No pain at all near the site of the nexplanon. Also denies any swelling or warmth in her arm. I advised that it may not be related at all and she could see her pcp if it continues to bother her. Patient states that she will go to Urgent Care just to make sure it is okay. Patient will call us back if she has any further issues.

## 2013-12-04 ENCOUNTER — Encounter: Payer: Self-pay | Admitting: Family Medicine

## 2014-03-17 ENCOUNTER — Emergency Department (HOSPITAL_COMMUNITY)
Admission: EM | Admit: 2014-03-17 | Discharge: 2014-03-18 | Disposition: A | Discharge status: Home / Self Care | Payer: Self-pay | Attending: Emergency Medicine | Admitting: Emergency Medicine

## 2014-03-17 DIAGNOSIS — F10129 Alcohol abuse with intoxication, unspecified: Secondary | ICD-10-CM | POA: Insufficient documentation

## 2014-03-17 DIAGNOSIS — T472X2A Poisoning by stimulant laxatives, intentional self-harm, initial encounter: Secondary | ICD-10-CM | POA: Insufficient documentation

## 2014-03-17 DIAGNOSIS — Z87891 Personal history of nicotine dependence: Secondary | ICD-10-CM | POA: Insufficient documentation

## 2014-03-17 DIAGNOSIS — F321 Major depressive disorder, single episode, moderate: Secondary | ICD-10-CM | POA: Insufficient documentation

## 2014-03-17 DIAGNOSIS — F1010 Alcohol abuse, uncomplicated: Secondary | ICD-10-CM | POA: Diagnosis present

## 2014-03-17 DIAGNOSIS — T50901A Poisoning by unspecified drugs, medicaments and biological substances, accidental (unintentional), initial encounter: Secondary | ICD-10-CM | POA: Diagnosis present

## 2014-03-17 DIAGNOSIS — Z79899 Other long term (current) drug therapy: Secondary | ICD-10-CM | POA: Insufficient documentation

## 2014-03-17 DIAGNOSIS — T50902A Poisoning by unspecified drugs, medicaments and biological substances, intentional self-harm, initial encounter: Secondary | ICD-10-CM

## 2014-03-17 DIAGNOSIS — F10929 Alcohol use, unspecified with intoxication, unspecified: Secondary | ICD-10-CM

## 2014-03-17 DIAGNOSIS — Z3202 Encounter for pregnancy test, result negative: Secondary | ICD-10-CM | POA: Insufficient documentation

## 2014-03-17 LAB — COMPREHENSIVE METABOLIC PANEL
ALK PHOS: 91 U/L (ref 39–117)
ALT: 25 U/L (ref 0–35)
AST: 38 U/L — ABNORMAL HIGH (ref 0–37)
Albumin: 4.5 g/dL (ref 3.5–5.2)
Anion gap: 10 (ref 5–15)
BUN: 6 mg/dL (ref 6–23)
CHLORIDE: 106 mmol/L (ref 96–112)
CO2: 26 mmol/L (ref 19–32)
CREATININE: 0.96 mg/dL (ref 0.50–1.10)
Calcium: 9.5 mg/dL (ref 8.4–10.5)
GFR calc Af Amer: 88 mL/min — ABNORMAL LOW (ref >90)
GFR calc non Af Amer: 76 mL/min — ABNORMAL LOW (ref >90)
Glucose, Bld: 98 mg/dL (ref 70–99)
POTASSIUM: 3.9 mmol/L (ref 3.5–5.1)
Sodium: 142 mmol/L (ref 135–145)
TOTAL PROTEIN: 9.3 g/dL — AB (ref 6.0–8.3)
Total Bilirubin: 0.6 mg/dL (ref 0.3–1.2)

## 2014-03-17 LAB — ETHANOL: Alcohol, Ethyl (B): 193 mg/dL — ABNORMAL HIGH (ref 0–9)

## 2014-03-17 LAB — CBC
HEMATOCRIT: 46.8 % — AB (ref 36.0–46.0)
Hemoglobin: 15.5 g/dL — ABNORMAL HIGH (ref 12.0–15.0)
MCH: 29.7 pg (ref 26.0–34.0)
MCHC: 33.1 g/dL (ref 30.0–36.0)
MCV: 89.7 fL (ref 78.0–100.0)
Platelets: 242 10*3/uL (ref 150–400)
RBC: 5.22 MIL/uL — ABNORMAL HIGH (ref 3.87–5.11)
RDW: 14 % (ref 11.5–15.5)
WBC: 9.5 10*3/uL (ref 4.0–10.5)

## 2014-03-17 LAB — ACETAMINOPHEN LEVEL: Acetaminophen (Tylenol), Serum: <10 ug/mL — ABNORMAL LOW (ref 10–30)

## 2014-03-17 LAB — SALICYLATE LEVEL: Salicylate Lvl: <4 mg/dL (ref 2.8–20.0)

## 2014-03-17 MED ORDER — ALUM & MAG HYDROXIDE-SIMETH 200-200-20 MG/5ML PO SUSP: 30.0000 mL | ORAL | Status: DC | PRN

## 2014-03-17 MED ORDER — LORAZEPAM 1 MG PO TABS
0.0000 mg | ORAL_TABLET | Freq: Four times a day (QID) | ORAL | Status: DC
Start: 2014-03-18 — End: 2014-03-18

## 2014-03-17 MED ORDER — ACETAMINOPHEN 325 MG PO TABS
650.0000 mg | ORAL_TABLET | ORAL | Status: DC | PRN
Start: 2014-03-17 — End: 2014-03-18

## 2014-03-17 MED ORDER — ONDANSETRON HCL 4 MG PO TABS: 4.0000 mg | ORAL_TABLET | Freq: Three times a day (TID) | ORAL | Status: DC | PRN

## 2014-03-17 MED ORDER — FAMOTIDINE 20 MG PO TABS: 20.0000 mg | ORAL_TABLET | Freq: Two times a day (BID) | ORAL | Status: DC

## 2014-03-17 MED ORDER — LORAZEPAM 1 MG PO TABS: 0.0000 mg | ORAL_TABLET | Freq: Two times a day (BID) | ORAL | Status: DC

## 2014-03-17 MED ORDER — NICOTINE 21 MG/24HR TD PT24: 21.0000 mg | MEDICATED_PATCH | Freq: Every day | TRANSDERMAL | Status: DC

## 2014-03-17 MED ORDER — LORATADINE 10 MG PO TABS
10.0000 mg | ORAL_TABLET | Freq: Every day | ORAL | Status: DC
  Filled 2014-03-17 (×2): qty 1

## 2014-03-17 MED ORDER — IBUPROFEN 200 MG PO TABS: 600.0000 mg | ORAL_TABLET | Freq: Three times a day (TID) | ORAL | Status: DC | PRN

## 2014-03-17 NOTE — ED Notes (Signed)
Per EMS pt has drank about half a fifth of alcohol 92 proof rum over the coarse of the day and pt took approximately 7 bisacodyl 5mg  tablets  Pt told EMS she wants to die  Pt has recently had 3 people related to her die, recently moved here from [place], and having frequent fights with her children

## 2014-03-17 NOTE — ED Notes (Signed)
Patient denied urge to void at this time

## 2014-03-17 NOTE — ED Provider Notes (Signed)
CSN: [id number]     Arrival date & time 03/17/14  2215 History   First MD Initiated Contact with Patient 03/17/14 2217     Chief Complaint  Patient presents with  . Alcohol Intoxication  . Suicidal     (Consider location/radiation/quality/duration/timing/severity/associated sxs/prior Treatment) HPI Comments: 34 year old female with a history of headaches and some depression. She presents intoxicated with alcohol stating that she was trying to commit suicide. She states that she has been drinking alcohol throughout the day and has been taking an overdose of medications. She was unsure what the medication was but on close inspection the packaging was for bisacodyl. She states that she has lost her father and her grandparents and has ongoing depression from this. She denies any other stressors, she is very tearful, she is unable to complete full sentences because of her intoxication, she is very tearful. The paramedic report that the patient has not been combative, they report that her youngest son's father was the one who called paramedics because of the patient's condition.  Patient is a 34 y.o. female presenting with intoxication. The history is provided by the patient.  Alcohol Intoxication    Past Medical History  Diagnosis Date  . Headache(784.0)   . Complication of anesthesia     Epidurals did not work w/labor   Past Surgical History  Procedure Laterality Date  . Vaginal delivery      X 5  . No past surgeries    . Wisdom tooth extraction     No family history on file. History  Substance Use Topics  . Smoking status: Former Smoker    Quit date: 12/22/1999  . Smokeless tobacco: Never Used  . Alcohol Use: Yes     Comment: occas. glass of wine   OB History    Gravida Para Term Preterm AB TAB SAB Ectopic Multiple Living   5 5 5       5      Review of Systems  Unable to perform ROS: Mental status change      Allergies  Review of patient's allergies indicates no known  allergies.  Home Medications   Prior to Admission medications   Medication Sig Start Date End Date Taking? Authorizing Provider  cetirizine-pseudoephedrine (ZYRTEC-D) 5-120 MG per tablet Take 1 tablet by mouth daily. 12/22/12   Marny LowensteinJulie N Wenzel, PA-C  Copper IUD 1 each by Intrauterine route continuous.    Historical Provider, MD  etonogestrel (IMPLANON) 68 MG IMPL implant Inject 1 each (68 mg total) into the skin once. 03/01/13   Minta BalsamMichael R Odom, MD  famotidine (PEPCID) 20 MG tablet Take 1 tablet (20 mg total) by mouth 2 (two) times daily. 12/22/12   Marny LowensteinJulie N Wenzel, PA-C   BP 125/85 mmHg  Pulse 87  Temp(Src) 97.9 F (36.6 C) (Oral)  Resp 20  Ht 5\' 7"  (1.702 m)  Wt 230 lb (104.327 kg)  BMI 36.01 kg/m2  SpO2 100% Physical Exam  Constitutional: She appears well-developed and well-nourished. No distress.  HENT:  Head: Normocephalic and atraumatic.  Mouth/Throat: Oropharynx is clear and moist. No oropharyngeal exudate.  Eyes: Conjunctivae and EOM are normal. Pupils are equal, round, and reactive to light. Right eye exhibits no discharge. Left eye exhibits no discharge. No scleral icterus.  Neck: Normal range of motion. Neck supple. No JVD present. No thyromegaly present.  Cardiovascular: Normal rate, regular rhythm, normal heart sounds and intact distal pulses.  Exam reveals no gallop and no friction rub.   No murmur  heard. Pulmonary/Chest: Effort normal and breath sounds normal. No respiratory distress. She has no wheezes. She has no rales.  Abdominal: Soft. Bowel sounds are normal. She exhibits no distension and no mass. There is no tenderness.  Musculoskeletal: Normal range of motion. She exhibits no edema or tenderness.  Lymphadenopathy:    She has no cervical adenopathy.  Neurological: She is alert. Coordination normal.  Slurs her words, moves all extremities 4, no obvious weakness, cranial nerves III through XII intact  Skin: Skin is warm and dry. No rash noted. No erythema.   Psychiatric: She has a normal mood and affect. Her behavior is normal.  Nursing note and vitals reviewed.   ED Course  Procedures (including critical care time) Labs Review Labs Reviewed  CBC  COMPREHENSIVE METABOLIC PANEL  ETHANOL  ACETAMINOPHEN LEVEL  SALICYLATE LEVEL  URINE RAPID DRUG SCREEN (HOSP PERFORMED)  POC URINE PREG, ED    Imaging Review No results found.   EKG Interpretation   Date/Time:  Saturday March 17 2014 22:32:45 EST Ventricular Rate:  83 PR Interval:  132 QRS Duration: 90 QT Interval:  385 QTC Calculation: 452 R Axis:   54 Text Interpretation:  Sinus rhythm Normal ECG since last tracing no  significant change Confirmed by Denver Harder  MD, Labaron Digirolamo ([id number]) on 03/17/2014  10:35:46 PM      MDM   Final diagnoses:  None    The patient is very tearful, she does endorse that this was a suicide attempt, I am unclear as the exact etiology of the patient's depression however she does cycle loss of multiple family members.  The patient will need to sober up, reevaluation, evaluation by psychiatry as the patient has had a significant suicide attempt despite this being with a medication that is not significantly harmful to the body. She will be tested for coingestants, Cardiac Monitor While She is Intoxicated.  ETOH elevated ~ 200, labs otherwise unremarkable - will consult TTS for suicide attempt.  Change of shift - care to be assumed by oncoming EDP pending TTS evaluation.   Vida Roller, MD 03/17/14 [id number]

## 2014-03-17 NOTE — ED Notes (Addendum)
Patient states that she found out today that her boyfriend of seven years cheated on her Patient also stated that she has "not gotten over the death of my father, grandfather and grandmother." Patient extremely tearful and apologetic towards staff--repeatedly stating "I'm sorry you have to deal with me" Patient attempted to harm herself by drinking ETOH and taking 8 tablets of Bisacodyl 5 mg Patient intoxicated upon arrival  Patient is alert and oriented x 4 upon arrival

## 2014-03-17 NOTE — ED Notes (Signed)
Patient's boyfriend and patient's son here to visit patient Patient asked if she wanted boyfriend to come back, especially since patient reported that he was one of the causative factors for SI Patient stated to nursing staff that her boyfriend could in fact come back

## 2014-03-18 DIAGNOSIS — F1010 Alcohol abuse, uncomplicated: Secondary | ICD-10-CM | POA: Diagnosis present

## 2014-03-18 DIAGNOSIS — T474X2A Poisoning by other laxatives, intentional self-harm, initial encounter: Secondary | ICD-10-CM

## 2014-03-18 DIAGNOSIS — T50901A Poisoning by unspecified drugs, medicaments and biological substances, accidental (unintentional), initial encounter: Secondary | ICD-10-CM | POA: Diagnosis present

## 2014-03-18 DIAGNOSIS — F321 Major depressive disorder, single episode, moderate: Secondary | ICD-10-CM | POA: Diagnosis present

## 2014-03-18 DIAGNOSIS — F10929 Alcohol use, unspecified with intoxication, unspecified: Secondary | ICD-10-CM | POA: Insufficient documentation

## 2014-03-18 LAB — RAPID URINE DRUG SCREEN, HOSP PERFORMED
Amphetamines: NOT DETECTED
Barbiturates: NOT DETECTED
Benzodiazepines: NOT DETECTED
Cocaine: NOT DETECTED
OPIATES: NOT DETECTED
Tetrahydrocannabinol: NOT DETECTED

## 2014-03-18 LAB — POC URINE PREG, ED: Preg Test, Ur: NEGATIVE

## 2014-03-18 NOTE — ED Notes (Signed)
[place] [facility]

## 2014-03-18 NOTE — ED Notes (Signed)
Check on the pt d/t prolonged stay in the BR.  She states that she is okay.

## 2014-03-18 NOTE — ED Notes (Signed)
Pt states that she is ready to go that she feels fine and "nothing is wrong with me."  Pt made aware that she cannot leave and if she tries to leave that we will involuntary commit her.  Pt is okay with staying at this time.

## 2014-03-18 NOTE — [facility] Suicide Risk Assessment (Addendum)
Suicide Risk Assessment  Discharge Assessment   [facility]   Demographic Factors:  Divorced or widowed  Total Time spent with patient: 45 minutes  Musculoskeletal: Strength & Muscle Tone: within normal limits Gait & Station: normal Patient leans: N/A  Psychiatric Specialty Exam:     Blood pressure 107/92, pulse 89, temperature 98.3 F (36.8 C), temperature source Oral, resp. rate 20, height 5\' 7"  (1.702 m), weight 230 lb (104.327 kg), SpO2 99 %.Body mass index is 36.01 kg/(m^2).  General Appearance: Casual  Eye Contact::  Good  Speech:  Clear and Coherent and Normal Rate  Volume:  Normal  Mood:  Depressed  Affect:  Congruent  Thought Process:  Coherent  Orientation:  Full (Time, Place, and Person)  Thought Content:  Rumination  Suicidal Thoughts:  No  Homicidal Thoughts:  No  Memory:  Immediate;   Good Recent;   Good Remote;   Good  Judgement:  Fair  Insight:  Fair  Psychomotor Activity:  Normal  Concentration:  Good  Recall:  Good  Fund of Knowledge:Good  Language: Good  Akathisia:  No  Handed:  Right  AIMS (if indicated):     Assets:  Communication Skills Desire for Improvement Financial Resources/Insurance Housing Intimacy Leisure Time Physical Health Resilience Social Support Talents/Skills Transportation Vocational/Educational  ADL's:  Intact  Cognition: WNL  Sleep:      Has this patient used any form of tobacco in the last 30 days? (Cigarettes, Smokeless Tobacco, Cigars, and/or Pipes) Smoking cessation prescription offered but patient refused.  Mental Status Per Nursing Assessment::   On Admission:     Current Mental Status by Physician: NA  Loss Factors: NA  Historical Factors: NA  Risk Reduction Factors:   Responsible for children under 34 years of age, Sense of responsibility to family, Religious beliefs about death, Employed, Living with another person, especially a relative and Positive social  support  Continued Clinical Symptoms:  Depression  Cognitive Features That Contribute To Risk:  None    Suicide Risk:  Minimal: No identifiable suicidal ideation.  Patients presenting with no risk factors but with morbid ruminations; may be classified as minimal risk based on the severity of the depressive symptoms  Principal Problem: Overdose Discharge Diagnoses:  Patient Active Problem List   Diagnosis Date Noted  . 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 Alcohol abuse [F10.10] 03/18/2014    Priority: High  . Overdose [T50.901A] 03/18/2014    Priority: High  . Alcohol intoxication [F10.129]       Plan Of Care/Follow-up recommendations:  Activity:  as tolerated Diet:  heart healthy diet  Is patient on multiple antipsychotic therapies at discharge:  No   Has Patient had three or more failed trials of antipsychotic monotherapy by history:  No  Recommended Plan for Multiple Antipsychotic Therapies: NA    LORD, JAMISON, PMH-NP 03/18/2014, 12:46 PM

## 2014-03-18 NOTE — BH Assessment (Signed)
Assessment completed. Consulted Maryjean Mornharles Kober, PA-C who agrees pt meet inpatient criteria. [facility] TTS will contact other facilities for placement.

## 2014-03-18 NOTE — BH Assessment (Signed)
Pt meets inpatient criteria. [facility]. Pt [facility] been [facility] to the following facilities: [facility], [facility], and [facility].

## 2014-03-18 NOTE — ED Notes (Signed)
Pt and her boyfriend went to the BR, pt ambulated to the BR.  While their kids are left in the waiting area.  Pt and her boyfriend was probably in the BR for at least 15 minutes, registration came to the desk and was wondering where the boyfriend is because he has to register pts and is no longer able to watch their kids.  Knocked on the BR door and told the pt and boyfriend that he needs to go back to his kids in the waiting area.

## 2014-03-18 NOTE — Consult Note (Signed)
[facility] Face-to-Face Psychiatry Consult   Reason for Consult:  Overdose Referring Physician:  EDP Patient Identification: Rachael Jones MRN:  [id number] Principal Diagnosis: Overdose Diagnosis:   Patient Active Problem List   Diagnosis Date Noted  . 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 Alcohol abuse [F10.10] 03/18/2014    Priority: High  . Overdose [T50.901A] 03/18/2014    Priority: High    Total Time spent with patient: 45 minutes  Subjective:   Rachael Jones is a 34 y.o. female patient does not warrant admission.  HPI:  The patient has been dealing with many stressors when she started drinking last night.  She reports being intoxicated and wanting to hurt herself at that time, took seven pills, which she found out later were laxatives.  Rachael Jones is sober today on assessment and denies suicidal/homicidal ideations, hallucinations, and alcohol/drug abuse issues (except last night).  She is a single parent of five children:  5, 8, 10, 12, and 13 with little assistance in their care.  Rachael Jones works and managed well until her boyfriend cheated on her along with her sister discovering a breast lump.   She wants to be there for her children and has no intentions of ever trying to hurt herself again.  [name] is agreeable and more than willing to go to a therapist for therapy.  She does report needing someone to talk to so she does not keep things bottle up inside.  Stable for discharge. HPI Elements:   Location:  generalized. Quality:  acute. Severity:  moderate. Timing:  intermittent. Duration:  brief. Context:  stressors.  Past Medical History:  Past Medical History  Diagnosis Date  . Headache(784.0)   . Complication of anesthesia     Epidurals did not work w/labor    Past Surgical History  Procedure Laterality Date  . Vaginal delivery      X 5  . No past surgeries    . Wisdom tooth extraction     Family History: No family history on  file. Social History:  History  Alcohol Use  . Yes    Comment: occas. glass of wine     History  Drug Use No    History   Social History  . Marital Status: Single    Spouse Name: N/A  . Number of Children: N/A  . Years of Education: N/A   Social History Main Topics  . Smoking status: Former Smoker    Quit date: 12/22/1999  . Smokeless tobacco: Never Used  . Alcohol Use: Yes     Comment: occas. glass of wine  . Drug Use: No  . Sexual Activity: Yes    Birth Control/ Protection: IUD   Other Topics Concern  . Not on file   Social History Narrative   Additional Social History:    History of alcohol / drug use?: Yes Name of Substance 1: Alcohol  1 - Age of First Use: unknown  1 - Amount (size/oz): "1 pint or 2" 1 - Frequency: daily  1 - Duration: 3 months  1 - Last Use / Amount: 03-17-14 "Rum and vodka" [id number]                   Allergies:  No Known Allergies  Vitals: Blood pressure 107/92, pulse 89, temperature 98.3 F (36.8 C), temperature source Oral, resp. rate 20, height  (1.702 m), weight 230 lb (104.327 kg), SpO2 99 %.  Risk to Self: Suicidal Ideation:  Yes-Currently Present Suicidal Intent: Yes-Currently Present Is patient at risk for suicide?: Yes Suicidal Plan?: Yes-Currently Present Specify Current Suicidal Plan: "Overdose with pills" Access to Means: Yes Specify Access to Suicidal Means: Pt has access to pills.  What has been your use of drugs/alcohol within the last 12 months?: Daily alcohol use reported.  How many times?: 0 Other Self Harm Risks: No other self harm risk identified at this time.  Triggers for Past Attempts: None known Intentional Self Injurious Behavior: None Risk to Others: Homicidal Ideation: No Thoughts of Harm to Others: No Current Homicidal Intent: No Current Homicidal Plan: No Access to Homicidal Means: No Identified Victim: NA History of harm to others?: No Assessment of Violence: On admission Violent  Behavior Description: No violent behaviors observed. Pt is calm and cooperative at this time.  Does patient have access to weapons?: Yes (Comment) (Knives ) Criminal Charges Pending?: No Does patient have a court date: No Prior Inpatient Therapy: Prior Inpatient Therapy: No Prior Outpatient Therapy: Prior Outpatient Therapy: No  Current Facility-Administered Medications  Medication Dose Route Frequency Provider Last Rate Last Dose  . acetaminophen (TYLENOL) tablet 650 mg  650 mg Oral Q4H PRN Vida RollerBrian D Miller, MD      . alum & mag hydroxide-simeth (MAALOX/MYLANTA) 200-200-20 MG/5ML suspension 30 mL  30 mL Oral PRN Vida RollerBrian D Miller, MD      . famotidine (PEPCID) tablet 20 mg  20 mg Oral BID Vida RollerBrian D Miller, MD      . ibuprofen (ADVIL,MOTRIN) tablet 600 mg  600 mg Oral Q8H PRN Vida RollerBrian D Miller, MD      . loratadine (CLARITIN) tablet 10 mg  10 mg Oral Daily Vida RollerBrian D Miller, MD      . LORazepam (ATIVAN) tablet 0-4 mg  0-4 mg Oral 4 times per day Vida RollerBrian D Miller, MD   0 mg at 03/18/14 0142   Followed by  . [START ON 03/20/2014] LORazepam (ATIVAN) tablet 0-4 mg  0-4 mg Oral Q12H Vida RollerBrian D Miller, MD      . nicotine (NICODERM CQ - dosed in mg/24 hours) patch 21 mg  21 mg Transdermal Daily Vida RollerBrian D Miller, MD   21 mg at 03/18/14 [id number]  . ondansetron (ZOFRAN) tablet 4 mg  4 mg Oral Q8H PRN Vida RollerBrian D Miller, MD       Current Outpatient Prescriptions  Medication Sig Dispense Refill  . etonogestrel (IMPLANON) 68 MG IMPL implant Inject 1 each (68 mg total) into the skin once. 1 each 0  . cetirizine-pseudoephedrine (ZYRTEC-D) 5-120 MG per tablet Take 1 tablet by mouth daily. (Patient not taking: Reported on 03/17/2014) 30 tablet 0  . famotidine (PEPCID) 20 MG tablet Take 1 tablet (20 mg total) by mouth 2 (two) times daily. (Patient not taking: Reported on 03/17/2014) 30 tablet 0    Musculoskeletal: Strength & Muscle Tone: within normal limits Gait & Station: normal Patient leans: N/A  Psychiatric Specialty Exam:      Blood pressure 107/92, pulse 89, temperature 98.3 F (36.8 C), temperature source Oral, resp. rate 20, height 5\' 7"  (1.702 m), weight 230 lb (104.327 kg), SpO2 99 %.Body mass index is 36.01 kg/(m^2).  General Appearance: Casual  Eye Contact::  Good  Speech:  Clear and Coherent and Normal Rate  Volume:  Normal  Mood:  Depressed  Affect:  Congruent  Thought Process:  Coherent  Orientation:  Full (Time, Place, and Person)  Thought Content:  Rumination  Suicidal Thoughts:  No  Homicidal Thoughts:  No  Memory:  Immediate;   Good Recent;   Good Remote;   Good  Judgement:  Fair  Insight:  Fair  Psychomotor Activity:  Normal  Concentration:  Good  Recall:  Good  Fund of Knowledge:Good  Language: Good  Akathisia:  No  Handed:  Right  AIMS (if indicated):     Assets:  Communication Skills Desire for Improvement Financial Resources/Insurance Housing Intimacy Leisure Time Physical Health Resilience Social Support Talents/Skills Transportation Vocational/Educational  ADL's:  Intact  Cognition: WNL  Sleep:      Medical Decision Making: Review of Psycho-Social Stressors (1), Review or order clinical lab tests (1) and Review of Medication Regimen & Side Effects (2)  Treatment Plan Summary: Daily contact with patient to assess and evaluate symptoms and progress in treatment, Medication management and Plan discharge home with follow-up resources for therapy  Plan:  No evidence of imminent risk to self or others at present.     Disposition: Discharge home with follow-up resources for therapy  Nanine Means, PMH-NP 03/18/2014 12:30 PM  Patient seen face-to-face for psychiatric evaluation, chart reviewed and case discussed with the physician extender and developed treatment plan. Patient has been sober from her alcohol intoxication and denies current symptoms of depression, anxiety and safety concerns contract for safety. Reviewed the information documented and agree with the  treatment plan.  Jaymie Mckiddy,JANARDHAHA R. 03/18/2014 3:00 PM

## 2014-03-18 NOTE — BH Assessment (Signed)
Tele Assessment Note   Rachael Jones is an 34 y.o. female presenting to [facility] after a suicide attempt. Pt stated "I am here for depression". Pt reported that she attempted suicide tonight by taking pills and drinking a whole lot of alcohol. Pt has been documented that pt reported to medical staff that she took 8 Bisacodyl. Pt did not report a psychiatric history or any previous suicide attempts. Pt is endorsing multiple depressive symptoms and shared that she is dealing with multiple stressors. Pt reported that she no longer has a support system after the death of her father and grandparents. PT shared that she has 5 children whose ages range from 5 to 64. Pt stated "no one helps me, no one ask if I am okay". Pt also shared that she recently lost her job because she did not have anyone to pick up her son from school. Pt denies HI and AVH at this time. Pt did not report any pending criminal charges or upcoming court dates. Pt reported that she drinks alcohol daily but did not report any illicit substance abuse. Pt did not report any physical, sexual or emotional abuse at this time. Inpatient treatment is recommended.   Axis I: Major Depression, single episode  Past Medical History:  Past Medical History  Diagnosis Date  . Headache(784.0)   . Complication of anesthesia     Epidurals did not work w/labor    Past Surgical History  Procedure Laterality Date  . Vaginal delivery      X 5  . No past surgeries    . Wisdom tooth extraction      Family History: No family history on file.  Social History:  reports that she quit smoking about 14 years ago. She has never used smokeless tobacco. She reports that she drinks alcohol. She reports that she does not use illicit drugs.  Additional Social History:  Alcohol / Drug Use History of alcohol / drug use?: Yes Substance #1 Name of Substance 1: Alcohol  1 - Age of First Use: unknown  1 - Amount (size/oz): "1 pint or 2" 1 - Frequency: daily  1 -  Duration: 3 months  1 - Last Use / Amount: 03-17-14 "Rum and vodka" BAL-193  CIWA: CIWA-Ar BP: 123/71 mmHg Pulse Rate: 106 Nausea and Vomiting: no nausea and no vomiting Tactile Disturbances: none Tremor: no tremor Auditory Disturbances: not present Paroxysmal Sweats: no sweat visible Visual Disturbances: not present Anxiety: mildly anxious Headache, Fullness in Head: none present Agitation: normal activity Orientation and Clouding of Sensorium: oriented and can do serial additions CIWA-Ar Total: 1 COWS:    PATIENT STRENGTHS: (choose at least two) Average or above average intelligence Capable of independent living  Allergies: No Known Allergies  Home Medications:  (Not in a hospital admission)  OB/GYN Status:  No LMP recorded.  General Assessment Data Location of Assessment: [facility] ED Is this a Tele or Face-to-Face Assessment?: Face-to-Face Is this an Initial Assessment or a Re-assessment for this encounter?: Initial Assessment Living Arrangements: Children Can pt return to current living arrangement?: Yes Admission Status: Voluntary Is patient capable of signing voluntary admission?: Yes Transfer from: Home Referral Source: Self/Family/Friend     [facility] Crisis [facility] Plan Living Arrangements: Children Name of Psychiatrist: No provider reported at this time.  Name of Therapist: No provider reported at this time.   Education Status Is patient currently in school?: No  Risk to self with the past 6 months Suicidal Ideation: Yes-Currently Present Suicidal Intent: Yes-Currently Present  Is patient at risk for suicide?: Yes Suicidal Plan?: Yes-Currently Present Specify Current Suicidal Plan: "Overdose with pills" Access to Means: Yes Specify Access to Suicidal Means: Pt has access to pills.  What has been your use of drugs/alcohol within the last 12 months?: Daily alcohol use reported.  Previous Attempts/Gestures: No How many times?: 0 Other Self Harm Risks: No other self  harm risk identified at this time.  Triggers for Past Attempts: None known Intentional Self Injurious Behavior: None Family Suicide History: No Recent stressful life event(s): Other (Comment) (Pt reported that she does not have a support system. ) Persecutory voices/beliefs?: No Depression: Yes Depression Symptoms: Despondent, Tearfulness, Insomnia, Isolating, Fatigue, Guilt, Loss of interest in usual pleasures, Feeling worthless/self pity, Feeling angry/irritable Substance abuse history and/or treatment for substance abuse?: Yes Suicide prevention information given to non-admitted patients: Not applicable  Risk to Others within the past 6 months Homicidal Ideation: No Thoughts of Harm to Others: No Current Homicidal Intent: No Current Homicidal Plan: No Access to Homicidal Means: No Identified Victim: NA History of harm to others?: No Assessment of Violence: On admission Violent Behavior Description: No violent behaviors observed. Pt is calm and cooperative at this time.  Does patient have access to weapons?: Yes (Comment) (Knives ) Criminal Charges Pending?: No Does patient have a court date: No  Psychosis Hallucinations: None noted Delusions: None noted  Mental Status Report Appear/Hygiene: In hospital gown Eye Contact: Good Motor Activity: Freedom of movement Speech: Logical/coherent Level of Consciousness: Alert Mood: Depressed, Sad Affect: Appropriate to circumstance Anxiety Level: Minimal Thought Processes: Relevant, Coherent Judgement: Partial Orientation: Person, Place, Time, Situation Obsessive Compulsive Thoughts/Behaviors: None  Cognitive Functioning Concentration: Normal Memory: Recent Intact, Remote Intact IQ: Average Insight: Fair Impulse Control: Poor Appetite: Poor Weight Loss: 0 Weight Gain: 100 (Pt reported that she gained 100lbs in 6 months. ) Sleep: Decreased Total Hours of Sleep: 3 Vegetative Symptoms: Staying in bed  ADLScreening [facility]  Assessment Services) Patient's cognitive ability adequate to safely complete daily activities?: Yes Patient able to express need for assistance with ADLs?: Yes Independently performs ADLs?: Yes (appropriate for developmental age)  Prior Inpatient Therapy Prior Inpatient Therapy: No  Prior Outpatient Therapy Prior Outpatient Therapy: No  ADL Screening (condition at time of admission) Patient's cognitive ability adequate to safely complete daily activities?: Yes Patient able to express need for assistance with ADLs?: Yes Independently performs ADLs?: Yes (appropriate for developmental age)       Abuse/Neglect Assessment (Assessment to be complete while patient is alone) Physical Abuse: Denies Verbal Abuse: Denies Sexual Abuse: Denies Exploitation of patient/patient's resources: Denies Self-Neglect: Denies          Additional Information 1:1 In Past 12 Months?: No CIRT Risk: No Elopement Risk: No Does patient have medical clearance?: Yes     Disposition:  Disposition Initial Assessment Completed for this Encounter: Yes Disposition of Patient: Inpatient treatment program  Dorcus Riga S 03/18/2014 12:41 AM

## 2014-07-13 ENCOUNTER — Encounter: Payer: Self-pay | Admitting: Obstetrics & Gynecology

## 2014-07-13 ENCOUNTER — Ambulatory Visit: Payer: Medicaid Other | Admitting: Obstetrics & Gynecology

## 2014-07-13 NOTE — Progress Notes (Signed)
Pt was here today for irregular periods.  Pt informed me that she had the Nexplanon inserted on 02/2013 and that her periods stopped.  Pt stated that her period started again in 02/2014 in which it has been going off/on.  She stated that she was having intercourse with her finance and then she started bleeding.  Pt stated that she has not had any bleeding for a few days now.  I advised pt that it sounds like it is related her Nexplanon.  I informed her that it is the main c/o from our pts is irregular bleeding.  I advised pt that I would gather information on [facility] for her so that she can decided on whether or not she wants to keep the Nexplanon or change to another form of contraception.  Pt stated understanding with no further questions.  Pamphlets given to pt concerning all contraception measures.    Faythe Casa, LPN  Agree with nurses's documentation of this patient's clinic encounter.  Tereso Newcomer, MD

## 2014-10-10 ENCOUNTER — Ambulatory Visit: Payer: Self-pay | Admitting: Obstetrics and Gynecology

## 2014-10-13 ENCOUNTER — Emergency Department (HOSPITAL_COMMUNITY): Payer: Medicaid Other

## 2014-10-13 ENCOUNTER — Emergency Department (HOSPITAL_COMMUNITY)
Admission: EM | Admit: 2014-10-13 | Discharge: 2014-10-13 | Disposition: A | Discharge status: Home / Self Care | Payer: Medicaid Other | Attending: Emergency Medicine | Admitting: Emergency Medicine

## 2014-10-13 ENCOUNTER — Encounter (HOSPITAL_COMMUNITY): Payer: Self-pay | Admitting: *Deleted

## 2014-10-13 DIAGNOSIS — S93402A Sprain of unspecified ligament of left ankle, initial encounter: Secondary | ICD-10-CM | POA: Insufficient documentation

## 2014-10-13 DIAGNOSIS — W108XXA Fall (on) (from) other stairs and steps, initial encounter: Secondary | ICD-10-CM | POA: Insufficient documentation

## 2014-10-13 DIAGNOSIS — Y9289 Other specified places as the place of occurrence of the external cause: Secondary | ICD-10-CM | POA: Diagnosis not present

## 2014-10-13 DIAGNOSIS — Y9389 Activity, other specified: Secondary | ICD-10-CM | POA: Diagnosis not present

## 2014-10-13 DIAGNOSIS — Z87891 Personal history of nicotine dependence: Secondary | ICD-10-CM | POA: Diagnosis not present

## 2014-10-13 DIAGNOSIS — Y998 Other external cause status: Secondary | ICD-10-CM | POA: Insufficient documentation

## 2014-10-13 DIAGNOSIS — Z79899 Other long term (current) drug therapy: Secondary | ICD-10-CM | POA: Insufficient documentation

## 2014-10-13 DIAGNOSIS — S99912A Unspecified injury of left ankle, initial encounter: Secondary | ICD-10-CM | POA: Diagnosis present

## 2014-10-13 MED ORDER — IBUPROFEN 400 MG PO TABS
600.0000 mg | ORAL_TABLET | Freq: Once | ORAL | Status: AC
  Administered 2014-10-13: 600 mg via ORAL
  Filled 2014-10-13: qty 3

## 2014-10-13 MED ORDER — NAPROXEN 500 MG PO TABS: 500.0000 mg | ORAL_TABLET | Freq: Two times a day (BID) | ORAL | Status: DC

## 2014-10-13 NOTE — Discharge Instructions (Signed)
Ankle Sprain  An ankle sprain is an injury to the strong, fibrous tissues (ligaments) that hold your ankle bones together.   HOME CARE   · Put ice on your ankle for 1-2 days or as told by your doctor.  ¨ Put ice in a plastic bag.  ¨ Place a towel between your skin and the bag.  ¨ Leave the ice on for 15-20 minutes at a time, every 2 hours while you are awake.  · Only take medicine as told by your doctor.  · Raise (elevate) your injured ankle above the level of your heart as much as possible for 2-3 days.  · Use crutches if your doctor tells you to. Slowly put your own weight on the affected ankle. Use the crutches until you can walk without pain.  · If you have a plaster splint:  ¨ Do not rest it on anything harder than a pillow for 24 hours.  ¨ Do not put weight on it.  ¨ Do not get it wet.  ¨ Take it off to shower or bathe.  · If given, use an elastic wrap or support stocking for support. Take the wrap off if your toes lose feeling (numb), tingle, or turn cold or blue.  · If you have an air splint:  ¨ Add or let out air to make it comfortable.  ¨ Take it off at night and to shower and bathe.  ¨ Wiggle your toes and move your ankle up and down often while you are wearing it.  GET HELP IF:  · You have rapidly increasing bruising or puffiness (swelling).  · Your toes feel very cold.  · You lose feeling in your foot.  · Your medicine does not help your pain.  GET HELP RIGHT AWAY IF:   · Your toes lose feeling (numb) or turn blue.  · You have severe pain that is increasing.  MAKE SURE YOU:   · Understand these instructions.  · Will watch your condition.  · Will get help right away if you are not doing well or get worse.  Document Released: 07/08/2007 Document Revised: 06/05/2013 Document Reviewed: 08/03/2011  ExitCare® Patient Information ©2015 [employer]. This information is not intended to replace advice given to you by your health care provider. Make sure you discuss any questions you have with your health care  provider.

## 2014-10-13 NOTE — ED Provider Notes (Signed)
CSN: [id number]     Arrival date & time 10/13/14  1613 History  This chart was scribed for [facility], NP, working with Linwood Dibbles, MD by Elon Spanner, ED Scribe. This patient was seen in room TR08C/TR08C and the patient's care was started at 4:41 PM.   Chief Complaint  Patient presents with  . Ankle Pain   The history is provided by the patient. No language interpreter was used.   HPI Comments: Rachael Jones is a 34 y.o. female with no significant hx who presents to the Emergency Department complaining of constant, moderate, sudden onset ankle pain and swelling onset 7:30 am today.  The patient reports she took a step backwards down the stairs and missed a step, causing her to roll her ankle and hearing 3 "cracks."  Patient is able to ambulate on the foot and has been standing a majority of the day where she works at [employer].  No treatments have been tried.   Past Medical History  Diagnosis Date  . Headache(784.0)   . Complication of anesthesia     Epidurals did not work w/labor   Past Surgical History  Procedure Laterality Date  . Vaginal delivery      X 5  . No past surgeries    . Wisdom tooth extraction     History reviewed. No pertinent family history. Social History  Substance Use Topics  . Smoking status: Former Smoker    Quit date: 12/22/1999  . Smokeless tobacco: Never Used  . Alcohol Use: Yes     Comment: occas. glass of wine   OB History    Gravida Para Term Preterm AB TAB SAB Ectopic Multiple Living   Review of Systems  Musculoskeletal: Positive for joint swelling and arthralgias.  all other systems negative   Allergies  Review of patient's allergies indicates no known allergies.  Home Medications   Prior to Admission medications   Medication Sig Start Date End Date Taking? Authorizing Provider  cetirizine-pseudoephedrine (ZYRTEC-D) 5-120 MG per tablet Take 1 tablet by mouth daily. Patient not taking: Reported on 03/17/2014 12/22/12    Marny Lowenstein, PA-C  etonogestrel (IMPLANON) 68 MG IMPL implant Inject 1 each (68 mg total) into the skin once. 03/01/13   Minta Balsam, MD  famotidine (PEPCID) 20 MG tablet Take 1 tablet (20 mg total) by mouth 2 (two) times daily. Patient not taking: Reported on 03/17/2014 12/22/12   Marny Lowenstein, PA-C  naproxen (NAPROSYN) 500 MG tablet Take 1 tablet (500 mg total) by mouth 2 (two) times daily. 10/13/14   Hope Orlene Och, NP   BP 115/61 mmHg  Pulse 98  Temp(Src) 98.5 F (36.9 C) (Oral)  Resp 14  Ht  (1.702 m)  Wt 227 lb (102.967 kg)  BMI 35.55 kg/m2  SpO2 100%  LMP 09/20/2014 Physical Exam  Constitutional: She is oriented to person, place, and time. She appears well-developed and well-nourished. No distress.  HENT:  Head: Normocephalic and atraumatic.  Eyes: Conjunctivae and EOM are normal.  Neck: Neck supple. No tracheal deviation present.  Cardiovascular: Normal rate.   Pulses:      Dorsalis pedis pulses are 2+ on the right side, and 2+ on the left side.  Pulmonary/Chest: Effort normal. No respiratory distress.  Musculoskeletal: Normal range of motion.  Left foot: Adequate circulation.  Tenderness to medial and lateral malleolus.  Swelling noted to lateral aspect.  Pain with ROM.  Neurological: She is alert and oriented to person, place, and time.  Skin: Skin is warm and dry.  Psychiatric: She has a normal mood and affect. Her behavior is normal.  Nursing note and vitals reviewed.   ED Course  Procedures (including critical care time)  DIAGNOSTIC STUDIES: Oxygen Saturation is 100% on RA, normal by my interpretation.    COORDINATION OF CARE:  4:45 PM Discussed plan to obtain imaging.  Patient acknowledges and agrees with plan.    Labs Review Labs Reviewed - No data to display  Imaging Review Dg Ankle Complete Left  10/13/2014   CLINICAL DATA:  Larey Seat down steps today, pain in LEFT ankle.  EXAM: LEFT ANKLE COMPLETE - 3+ VIEW  COMPARISON:  None.  FINDINGS: There is  no evidence of fracture, dislocation, or joint effusion. There is no evidence of arthropathy or other focal bone abnormality. Lateral soft tissue swelling.  IMPRESSION: No ankle fracture. Lateral soft tissue swelling. Ankle mortise intact.   Electronically Signed   By: Elsie Stain M.D.   On: 10/13/2014 17:43    MDM  34 y.o. female with pain and mild swelling to the left ankle s/p injury earlier today. Stable for d/c without neurovascular compromise. ASO applied, ice, elevation and NSAIDS. Discussed with the patient and all questioned fully answered. She will follow up with ortho or return here if any problems arise.   Final diagnoses:  Ankle sprain, left, initial encounter   I personally performed the services described in this documentation, which was scribed in my presence. The recorded information has been reviewed and is accurate.    [address] [place], [place] 10/13/14 2105  Linwood Dibbles, MD 10/14/14 2041

## 2014-10-13 NOTE — ED Notes (Signed)
Pt reports falling on steps today and now has left ankle pain.

## 2014-10-29 ENCOUNTER — Ambulatory Visit: Payer: Self-pay | Admitting: Medical

## 2014-11-19 ENCOUNTER — Ambulatory Visit: Payer: Self-pay | Admitting: Obstetrics and Gynecology

## 2014-12-07 ENCOUNTER — Other Ambulatory Visit (HOSPITAL_COMMUNITY)
Admission: RE | Admit: 2014-12-07 | Discharge: 2014-12-07 | Disposition: A | Discharge status: Home / Self Care | Payer: Medicaid Other | Source: Ambulatory Visit | Attending: Obstetrics & Gynecology | Admitting: Obstetrics & Gynecology

## 2014-12-07 ENCOUNTER — Encounter: Payer: Self-pay | Admitting: Obstetrics & Gynecology

## 2014-12-07 ENCOUNTER — Ambulatory Visit (INDEPENDENT_AMBULATORY_CARE_PROVIDER_SITE_OTHER): Payer: Medicaid Other | Admitting: Obstetrics & Gynecology

## 2014-12-07 VITALS — BP 110/71 | HR 71 | Temp 98.3°F | Ht 67.0 in | Wt 226.7 lb

## 2014-12-07 DIAGNOSIS — Z1151 Encounter for screening for human papillomavirus (HPV): Secondary | ICD-10-CM

## 2014-12-07 DIAGNOSIS — Z3046 Encounter for surveillance of implantable subdermal contraceptive: Secondary | ICD-10-CM | POA: Diagnosis not present

## 2014-12-07 DIAGNOSIS — Z30014 Encounter for initial prescription of intrauterine contraceptive device: Secondary | ICD-10-CM

## 2014-12-07 DIAGNOSIS — Z23 Encounter for immunization: Secondary | ICD-10-CM

## 2014-12-07 DIAGNOSIS — Z30430 Encounter for insertion of intrauterine contraceptive device: Secondary | ICD-10-CM | POA: Diagnosis present

## 2014-12-07 DIAGNOSIS — Z124 Encounter for screening for malignant neoplasm of cervix: Secondary | ICD-10-CM | POA: Diagnosis not present

## 2014-12-07 DIAGNOSIS — Z01419 Encounter for gynecological examination (general) (routine) without abnormal findings: Secondary | ICD-10-CM | POA: Insufficient documentation

## 2014-12-07 DIAGNOSIS — Z0000 Encounter for general adult medical examination without abnormal findings: Secondary | ICD-10-CM

## 2014-12-07 MED ORDER — PARAGARD INTRAUTERINE COPPER IU IUD
1.0000 | INTRAUTERINE_SYSTEM | Freq: Once | INTRAUTERINE | Status: AC
  Administered 2014-12-07: 1 via INTRAUTERINE

## 2014-12-07 NOTE — Progress Notes (Signed)
Subjective:    Rachael Jones is a 34 y.o. engaged AA P5  female who presents for an annual exam. The patient has no complaints today except she would like to change from Nexplanon to Paragard due to 80# weight gain with Nexplanon and irregular cycles.  The patient is sexually active. GYN screening history: last pap: was normal. The patient wears seatbelts: yes. The patient participates in regular exercise: no. Has the patient ever been transfused or tattooed?: yes. The patient reports that there is not domestic violence in her life.   Menstrual History: OB History    Gravida Para Term Preterm AB TAB SAB Ectopic Multiple Living   Menarche age: 57  Patient's last menstrual period was 12/07/2014.    The following portions of the patient's history were reviewed and updated as appropriate: allergies, current medications, past family history, past medical history, past social history, past surgical history and problem list.  Review of Systems Pertinent items are noted in HPI.  Works at [employer] for 7 years (lives together)  Objective:    BP 110/71 mmHg  Pulse 71  Temp(Src) 98.3 F (36.8 C)  Ht  (1.702 m)  Wt 226 lb 11.2 oz (102.83 kg)  BMI 35.50 kg/m2  LMP 12/07/2014  General Appearance:    Alert, cooperative, no distress, appears stated age  Head:    Normocephalic, without obvious abnormality, atraumatic  Eyes:    PERRL, conjunctiva/corneas clear, EOM's intact, fundi    benign, both eyes  Ears:    Normal TM's and external ear canals, both ears  Nose:   Nares normal, septum midline, mucosa normal, no drainage    or sinus tenderness  Throat:   Lips, mucosa, and tongue normal; teeth and gums normal  Neck:   Supple, symmetrical, trachea midline, no adenopathy;    thyroid:  no enlargement/tenderness/nodules; no carotid   bruit or JVD  Back:     Symmetric, no curvature, ROM normal, no CVA tenderness  Lungs:     Clear to auscultation  bilaterally, respirations unlabored  Chest Wall:    No tenderness or deformity   Heart:    Regular rate and rhythm, S1 and S2 normal, no murmur, rub   or gallop  Breast Exam:    No tenderness, masses, or nipple abnormality  Abdomen:     Soft, non-tender, bowel sounds active all four quadrants,    no masses, no organomegaly  Genitalia:    Normal female without lesion, discharge or tenderness     Extremities:   Extremities normal, atraumatic, no cyanosis or edema  Pulses:   2+ and symmetric all extremities  Skin:   Skin color, texture, turgor normal, no rashes or lesions  Lymph nodes:   Cervical, supraclavicular, and axillary nodes normal  Neurologic:   CNII-XII intact, normal strength, sensation and reflexes    throughout  .  Consent was signed and time out was done. Her right arm was prepped with betadine after establishing the position of the Nexplanon. The area was infiltrated with 2 cc of 1% lidocaine. A small incision was made and the intact rod was easily removed. A steristrip was placed and her arm was noted to be hemostatic. It was bandaged.  She tolerated the procedure well.  UPT negative, consent signed, Time out procedure done. Cervix prepped with betadine and grasped with a single tooth tenaculum. Paragard was easily placed and the  strings were cut to 3-4 cm. Uterus sounded to 9 cm. She tolerated the procedure well.     Assessment:    Healthy female exam.    Plan:     Breast self exam technique reviewed and patient encouraged to perform self-exam monthly. Thin prep Pap smear. with cotesting Contraception- Paragard Flu vaccine today

## 2014-12-11 LAB — CYTOLOGY - PAP

## 2016-11-05 ENCOUNTER — Encounter: Payer: Self-pay | Admitting: Student

## 2016-11-05 ENCOUNTER — Other Ambulatory Visit (HOSPITAL_COMMUNITY)
Admission: RE | Admit: 2016-11-05 | Discharge: 2016-11-05 | Disposition: A | Discharge status: Home / Self Care | Payer: Medicaid Other | Source: Ambulatory Visit | Attending: Student | Admitting: Student

## 2016-11-05 ENCOUNTER — Ambulatory Visit (INDEPENDENT_AMBULATORY_CARE_PROVIDER_SITE_OTHER): Payer: Medicaid Other | Admitting: Student

## 2016-11-05 VITALS — BP 116/67 | HR 66 | Ht 67.0 in | Wt 154.2 lb

## 2016-11-05 DIAGNOSIS — Z113 Encounter for screening for infections with a predominantly sexual mode of transmission: Secondary | ICD-10-CM | POA: Diagnosis not present

## 2016-11-05 DIAGNOSIS — Z9189 Other specified personal risk factors, not elsewhere classified: Secondary | ICD-10-CM

## 2016-11-05 DIAGNOSIS — Z0000 Encounter for general adult medical examination without abnormal findings: Secondary | ICD-10-CM | POA: Insufficient documentation

## 2016-11-05 DIAGNOSIS — Z1151 Encounter for screening for human papillomavirus (HPV): Secondary | ICD-10-CM

## 2016-11-05 DIAGNOSIS — Z124 Encounter for screening for malignant neoplasm of cervix: Secondary | ICD-10-CM

## 2016-11-05 DIAGNOSIS — Z7251 High risk heterosexual behavior: Secondary | ICD-10-CM | POA: Diagnosis not present

## 2016-11-05 DIAGNOSIS — R109 Unspecified abdominal pain: Secondary | ICD-10-CM | POA: Diagnosis present

## 2016-11-05 DIAGNOSIS — Z30432 Encounter for removal of intrauterine contraceptive device: Secondary | ICD-10-CM | POA: Diagnosis not present

## 2016-11-05 MED ORDER — ETONOGESTREL-ETHINYL ESTRADIOL 0.12-0.015 MG/24HR VA RING: 1.0000 | VAGINAL_RING | VAGINAL | 11 refills | Status: DC

## 2016-11-05 NOTE — Patient Instructions (Signed)
Preventing Sexually Transmitted Infections, Adult Sexually transmitted infections (STIs) are diseases that are passed (transmitted) from person to person through bodily fluids exchanged during sex or sexual contact. Bodily fluids include saliva, semen, blood, vaginal mucus, and urine. You may have an increased risk for developing an STI if you have unprotected oral, vaginal, or anal sex. Some common STIs include:  Herpes.  Hepatitis B.  Chlamydia.  Gonorrhea.  Syphilis.  HPV (human papillomavirus).  HIV (humanimmunodeficiency virus), the virus that can cause AIDS (acquired immunodeficiency virus).  How can I protect myself from sexually transmitted infections? The only way to completely prevent STIs is not to have sex of any kind (practice abstinence). This includes oral, vaginal, or anal sex. If you are sexually active, take these actions to lower your risk of getting an STI:  Have only one sex partner (be monogamous) or limit the number of sexual partners you have.  Stay up-to-date on immunizations. Certain vaccines can lower your risk of getting certain STIs, such as: ? Hepatitis A and B vaccines. You may have been vaccinated as a young child, but likely need a booster shot as a teen or young adult. ? HPV vaccine. This vaccine is recommended if you are a man under age 22 or a woman under age 27.  Use methods that prevent the exchange of body fluids between partners (barrier protection) every time you have sex. Barrier protection can be used during oral, vaginal, or anal sex. Commonly used barrier methods include: ? Female condom. ? Female condom. ? Dental dam.  Get tested regularly for STIs. Have your sexual partner get tested regularly as well.  Avoid mixing alcohol, drugs, and sex. Alcohol and drug use can affect your ability to make good decisions and can lead to risky sexual behaviors.  Ask your health care provider about taking pre-exposure prophylaxis (PrEP) to prevent HIV  infection if you: ? Have a HIV-positive sexual partner. ? Have multiple sexual partners or partners who do not know their HIV status, and do not regularly use a condom during sex. ? Use injection drugs and share needles.  Birth control pills, injections, implants, and intrauterine devices (IUDs) do not protect against STIs. To prevent both STIs and pregnancy, always use a condom with another form of birth control. Some STIs, such as herpes, are spread through skin to skin contact. A condom does not protect you from getting such STIs. If you or your partner have herpes and there is an active flare with open sores, avoid all sexual contact. Why are these changes important? Taking steps to practice safe sex protects you and others. Many STIs can be cured. However, some STIs are not curable and will affect you for the rest of your life. STIs can be passed on to another person even if you do not have symptoms. What can happen if changes are not made? Certain STIs may:  Require you to take medicine for the rest of your life.  Affect your ability to have children (your fertility).  Increase your risk for developing another STI or certain serious health conditions, such as: ? Cervical cancer. ? Head and neck cancer. ? Pelvic inflammatory disease (PID) in women. ? Organ damage or damage to other parts of your body, if the infection spreads.  Be passed to a baby during childbirth.  How are sexually transmitted infections treated? If you or your partner know or think that you may have an STI:  Talk with your healthcare provider about what can be   done to treat it. Some STIs can be treated and cured with medicines.  For curable STIs, you and your partner should avoid sex during treatment and for several days after treatment is complete.  You and your partner should both be treated at the same time, if there is any chance that your partner is infected as well. If you get treatment but your partner  does not, your partner can re-infect you when you resume sexual contact.  Do not have unprotected sex.  Where to find more information: Learn more about sexually transmitted diseases and infections from:  Centers for Disease Control and Prevention: ? More information about specific STIs: www.cdc.gov/std ? Find places to get sexual health counseling and treatment for free or for a low cost: gettested.cdc.gov  U.S. Department of Health and Human Services: www.womenshealth.gov/publications/our-publications/fact-sheet/sexually-transmitted-infections.html  Summary  The only way to completely prevent STIs is not to have sex (practice abstinence), including oral, vaginal, or anal sex.  STIs can spread through saliva, semen, blood, vaginal mucus, urine, or sexual contact.  If you do have sex, limit your number of sexual partners and use a barrier protection method every time you have sex.  If you develop an STI, get treated right away and ask your partner to be treated as well. Do not resume having sex until both of you have completed treatment for the STI. This information is not intended to replace advice given to you by your health care provider. Make sure you discuss any questions you have with your health care provider. Document Released: 01/16/2016 Document Revised: 01/16/2016 Document Reviewed: 01/16/2016 Elsevier Interactive Patient Education  2018 [employer]  

## 2016-11-06 DIAGNOSIS — Z7251 High risk heterosexual behavior: Secondary | ICD-10-CM | POA: Insufficient documentation

## 2016-11-06 LAB — HIV ANTIBODY (ROUTINE TESTING W REFLEX): HIV SCREEN 4TH GENERATION: NONREACTIVE

## 2016-11-06 LAB — RPR: RPR: NONREACTIVE

## 2016-11-06 NOTE — Progress Notes (Signed)
Subjective:     Patient ID: Rachael Jones, female   DOB: 1980-05-18, 36 y.o.   MRN: [id number]  HPI Patient Mande Auvil is a 36 y.o. [id number] Here for STI testing and pap after ending a recent relationship. Patient also had an episode of abdominal pain on Monday that lasted for about 20 minutes. She felt a deep pain in her uterus that subsequently went away on its own. For this reason, she would like her IUD removed.   Review of Systems  Constitutional: Negative.   HENT: Negative.   Respiratory: Negative.   Cardiovascular: Negative.   Genitourinary: Negative.   Neurological: Negative.        Objective:   Physical Exam  Constitutional: She is oriented to person, place, and time. She appears well-developed.  HENT:  Head: Normocephalic.  Neck: Normal range of motion.  Cardiovascular: Normal rate.   Pulmonary/Chest: Effort normal.  Abdominal: Soft.  Genitourinary: Vagina normal.  Musculoskeletal: Normal range of motion.  Neurological: She is alert and oriented to person, place, and time.  Skin: Skin is warm.  Psychiatric: She has a normal mood and affect.  NEFG; no lesions on vaginal walls or cervix. Patient had moderate amount of blood in the vagina due to menses. No CMT, normal adnexa and uterus.      Assessment:     Healthy ob-gyn exam.   Patient signed consent; a time out was performed. After placing speculum, the cervix was swabbed with betadine and strings visualized and grasped with ring forceps. Patient coughed once and with gentle traction IUD was removed intact. Patient had no pain after procedure.      Plan:     1. STI labs drawn.  2. Pap smear and GC CT/trich testing ordered.  3. RX for Nuvaring sent.    Luna Kitchens CNM

## 2016-11-10 LAB — CYTOLOGY - PAP
CHLAMYDIA, DNA PROBE: NEGATIVE
DIAGNOSIS: NEGATIVE
Neisseria Gonorrhea: NEGATIVE
Trichomonas: NEGATIVE

## 2017-05-04 ENCOUNTER — Ambulatory Visit: Payer: Self-pay | Admitting: Obstetrics and Gynecology

## 2017-05-04 ENCOUNTER — Ambulatory Visit: Payer: Self-pay

## 2018-02-02 NOTE — L&D Delivery Note (Signed)
OB/GYN Faculty Practice Delivery Note  Rachael Jones is a 38 y.o. now [id number] s/p NSVD at [redacted]w[redacted]d who was admitted for postdates IOL .   ROM: 4h 19m with clear fluid GBS Status: Positive Maximum Maternal Temperature: 98.7  Delivery Note At 6:55 PM a viable female was delivered via Vaginal, Spontaneous (Presentation: Right Occiput Anterior).  APGAR: 8, 9; weight 8 lbs 0.1 oz (3631 gm).   Placenta status: Spontaneous via Delena Bali, Intact.  Cord: 3 vessels with the following complications: None.  Cord pH: n/a  Anesthesia: Epidural Episiotomy: None Lacerations: None Suture Repair: n/a Est. Blood Loss (mL): 311  Postpartum Planning [x]  Mom to postpartum.  Baby to Couplet care / Skin to Skin.  [x]  plans to breast feed [x]  message to sent to schedule follow-up  [ ]  vaccines declined  Laury Deep, MSN, CNM 01/24/19, 7:16 PM

## 2018-06-28 ENCOUNTER — Ambulatory Visit (INDEPENDENT_AMBULATORY_CARE_PROVIDER_SITE_OTHER): Payer: Medicaid Other | Admitting: Family Medicine

## 2018-06-28 ENCOUNTER — Other Ambulatory Visit: Payer: Self-pay

## 2018-06-28 ENCOUNTER — Encounter: Payer: Self-pay | Admitting: Family Medicine

## 2018-06-28 VITALS — BP 116/64 | HR 76 | Temp 98.5°F | Ht 67.0 in | Wt 137.8 lb

## 2018-06-28 DIAGNOSIS — N926 Irregular menstruation, unspecified: Secondary | ICD-10-CM

## 2018-06-28 DIAGNOSIS — Z3201 Encounter for pregnancy test, result positive: Secondary | ICD-10-CM

## 2018-06-28 DIAGNOSIS — Z7689 Persons encountering health services in other specified circumstances: Secondary | ICD-10-CM

## 2018-06-28 DIAGNOSIS — Z131 Encounter for screening for diabetes mellitus: Secondary | ICD-10-CM

## 2018-06-28 DIAGNOSIS — Z3A08 8 weeks gestation of pregnancy: Secondary | ICD-10-CM | POA: Diagnosis not present

## 2018-06-28 DIAGNOSIS — Z0000 Encounter for general adult medical examination without abnormal findings: Secondary | ICD-10-CM

## 2018-06-28 DIAGNOSIS — R112 Nausea with vomiting, unspecified: Secondary | ICD-10-CM

## 2018-06-28 DIAGNOSIS — Z09 Encounter for follow-up examination after completed treatment for conditions other than malignant neoplasm: Secondary | ICD-10-CM

## 2018-06-28 LAB — POCT URINALYSIS DIP (MANUAL ENTRY)
Bilirubin, UA: NEGATIVE
Glucose, UA: NEGATIVE mg/dL
Ketones, POC UA: NEGATIVE mg/dL
Leukocytes, UA: NEGATIVE
Nitrite, UA: NEGATIVE
Protein Ur, POC: NEGATIVE mg/dL
Spec Grav, UA: 1.02 (ref 1.010–1.025)
Urobilinogen, UA: 0.2 E.U./dL
pH, UA: 7 (ref 5.0–8.0)

## 2018-06-28 LAB — POCT GLYCOSYLATED HEMOGLOBIN (HGB A1C): Hemoglobin A1C: 5.2 % (ref 4.0–5.6)

## 2018-06-28 LAB — POCT URINE PREGNANCY: Preg Test, Ur: POSITIVE — AB

## 2018-06-28 NOTE — Progress Notes (Signed)
[facility]   New Patient--Establish Care  Subjective:  Patient ID: Rachael Jones, female    DOB: 1980/12/18  Age: 38 y.o. MRN: [id number]  CC:  Chief Complaint  Patient presents with  . Establish Care    HPI Rachael Jones is a 38 year old female who presents today to Establish Care.   Past Medical History:  Diagnosis Date  . Complication of anesthesia    Epidurals did not work w/labor   Current Status: She is a previous patient of [facility] in [place]. Since her last office visit there,  she is doing well with no complaints. Her anxiety is moderate today. She denies suicidal ideations, homicidal ideations, or auditory hallucinations. She is currently [redacted] weeks gestation. She has began taking Pre-natal vitamins daily. She has increased nausea and vomiting. She currently has 5 children.   She denies fevers, chills, fatigue, recent infections, weight loss, and night sweats. She has not had any visual changes, headaches, dizziness, and falls. No chest pain, heart palpitations, cough and shortness of breath reported. No reports of GI problems such as diarrhea, and constipation. She has no reports of blood in stools, dysuria and hematuria. She denies pain today.    Past Surgical History:  Procedure Laterality Date  . NO PAST SURGERIES    . VAGINAL DELIVERY     X 5  . WISDOM TOOTH EXTRACTION      Family History  Problem Relation Age of Onset  . Other Mother        healthy   . Heart attack Father     Social History   Socioeconomic History  . Marital status: Single    Spouse name: Not on file  . Number of children: Not on file  . Years of education: Not on file  . Highest education level: Not on file  Occupational History  . Not on file  Social Needs  . Financial resource strain: Not on file  . Food insecurity:    Worry: Not on file    Inability: Not on file  . Transportation needs:    Medical: Not on file   Non-medical: Not on file  Tobacco Use  . Smoking status: Former Smoker    Last attempt to quit: 12/22/1999    Years since quitting: 18.5  . Smokeless tobacco: Never Used  Substance and Sexual Activity  . Alcohol use: Yes    Comment: occas. glass of wine  . Drug use: No  . Sexual activity: Yes    Birth control/protection: I.U.D.  Lifestyle  . Physical activity:    Days per week: Not on file    Minutes per session: Not on file  . Stress: Not on file  Relationships  . Social connections:    Talks on phone: Not on file    Gets together: Not on file    Attends religious service: Not on file    Active member of club or organization: Not on file    Attends meetings of clubs or organizations: Not on file    Relationship status: Not on file  . Intimate partner violence:    Fear of current or ex partner: Not on file    Emotionally abused: Not on file    Physically abused: Not on file    Forced sexual activity: Not on file  Other Topics Concern  . Not on file  Social History Narrative  . Not on file    Outpatient Medications  Prior to Visit  Medication Sig Dispense Refill  . DOCOSAHEXAENOIC ACID PO Take 1 tablet by mouth.    . etonogestrel-ethinyl estradiol (NUVARING) 0.12-0.015 MG/24HR vaginal ring Place 1 each vaginally every 21 ( twenty-one) days. Insert one (1) ring vaginally and leave in place for three (3) weeks, then remove for one (1) week. 1 each 11   No facility-administered medications prior to visit.     No Known Allergies  ROS Review of Systems  Constitutional: Negative.   HENT: Negative.   Eyes: Negative.   Respiratory: Negative.   Cardiovascular: Negative.   Gastrointestinal: Positive for nausea and vomiting.  Endocrine: Negative.   Genitourinary: Negative.   Musculoskeletal: Negative.   Skin: Negative.   Neurological: Negative.   Hematological: Negative.   Psychiatric/Behavioral: Negative.       Objective:    Physical Exam  Constitutional: She is  oriented to person, place, and time. She appears well-developed and well-nourished.  HENT:  Head: Normocephalic and atraumatic.  Eyes: Conjunctivae are normal.  Neck: Normal range of motion. Neck supple.  Cardiovascular: Normal rate, regular rhythm, normal heart sounds and intact distal pulses.  Pulmonary/Chest: Effort normal and breath sounds normal.  Abdominal: Soft. Bowel sounds are normal.  Musculoskeletal: Normal range of motion.  Neurological: She is alert and oriented to person, place, and time. She has normal reflexes.  Skin: Skin is warm and dry.  Psychiatric: She has a normal mood and affect. Her behavior is normal. Judgment and thought content normal.  Nursing note and vitals reviewed.   BP 116/64 (BP Location: Right Arm, Patient Position: Sitting, Cuff Size: Small)   Pulse 76   Temp 98.5 F (36.9 C) (Oral)   Ht 5\' 7"  (1.702 m)   Wt 137 lb 12.8 oz (62.5 kg)   LMP 04/20/2018   SpO2 100%   BMI 21.58 kg/m  Wt Readings from Last 3 Encounters:  06/28/18 137 lb 12.8 oz (62.5 kg)  11/05/16 154 lb 3.2 oz (69.9 kg)  12/07/14 226 lb 11.2 oz (102.8 kg)     Health Maintenance Due  Topic Date Due  . TETANUS/TDAP  02/24/1999    There are no preventive care reminders to display for this patient.  No results found for: TSH Lab Results  Component Value Date   WBC 9.5 03/17/2014   HGB 15.5 (H) 03/17/2014   HCT 46.8 (H) 03/17/2014   MCV 89.7 03/17/2014   PLT 242 03/17/2014   Lab Results  Component Value Date   NA 142 03/17/2014   K 3.9 03/17/2014   CO2 26 03/17/2014   GLUCOSE 98 03/17/2014   BUN 6 03/17/2014   CREATININE 0.96 03/17/2014   BILITOT 0.6 03/17/2014   ALKPHOS 91 03/17/2014   AST 38 (H) 03/17/2014   ALT 25 03/17/2014   PROT 9.3 (H) 03/17/2014   ALBUMIN 4.5 03/17/2014   CALCIUM 9.5 03/17/2014   ANIONGAP 10 03/17/2014   No results found for: CHOL No results found for: HDL No results found for: LDLCALC No results found for: TRIG No results found  for: [facility] Lab Results  Component Value Date   HGBA1C 5.2 06/28/2018      Assessment & Plan:   1. Encounter to establish care  2. Missed periods Pregnancy test is positive.   - POCT urine pregnancy  3. Positive pregnancy test  4. [redacted] weeks gestation of pregnancy - Ambulatory referral to Obstetrics / Gynecology  5. Nausea and vomiting, intractability of vomiting not specified, unspecified vomiting type Stable today.  6. Healthcare maintenance - CBC with Differential - Comprehensive metabolic panel - TSH - Vitamin D, 25-hydroxy - Vitamin B12 - Lipid Panel  7. Screening for diabetes mellitus Hgb A1c is within normal range of 5.2 today.  She will continue to decrease foods/beverages high in sugars and carbs and follow Heart Healthy or DASH diet. Increase physical activity to at least 30 minutes cardio exercise daily.  - POCT glycosylated hemoglobin (Hb A1C) - POCT urinalysis dipstick  8. Follow up She will follow up in 6 months.   No orders of the defined types were placed in this encounter.   Orders Placed This Encounter  Procedures  . CBC with Differential  . Comprehensive metabolic panel  . TSH  . Vitamin D, 25-hydroxy  . Vitamin B12  . Lipid Panel  . Ambulatory referral to Obstetrics / Gynecology  . POCT glycosylated hemoglobin (Hb A1C)  . POCT urinalysis dipstick  . POCT urine pregnancy     Referral Orders     Ambulatory referral to Obstetrics / Gynecology   Raliegh Ip,  MSN, FNP-BC [facility] [address] [place], [place] [address] [phone]   Problem List Items Addressed This Visit    None    Visit Diagnoses    Encounter to establish care    -  Primary   Missed periods       Relevant Orders   POCT urine pregnancy (Completed)   Positive pregnancy test       [redacted] weeks gestation of pregnancy       Relevant Orders   Ambulatory referral to Obstetrics / Gynecology   Nausea and vomiting,  intractability of vomiting not specified, unspecified vomiting type       Healthcare maintenance       Relevant Orders   CBC with Differential   Comprehensive metabolic panel   TSH   Vitamin D, 25-hydroxy   Vitamin B12   Lipid Panel   Screening for diabetes mellitus       Relevant Orders   POCT glycosylated hemoglobin (Hb A1C) (Completed)   POCT urinalysis dipstick (Completed)   Follow up          No orders of the defined types were placed in this encounter.   Follow-up: Return in about 6 months (around 12/29/2018).    Kallie Locks, FNP

## 2018-06-29 DIAGNOSIS — Z3A08 8 weeks gestation of pregnancy: Secondary | ICD-10-CM | POA: Insufficient documentation

## 2018-06-29 LAB — CBC WITH DIFFERENTIAL/PLATELET
Basophils Absolute: 0 10*3/uL (ref 0.0–0.2)
Basos: 0 %
EOS (ABSOLUTE): 0.2 10*3/uL (ref 0.0–0.4)
Eos: 2 %
Hematocrit: 38 % (ref 34.0–46.6)
Hemoglobin: 13 g/dL (ref 11.1–15.9)
Immature Grans (Abs): 0 10*3/uL (ref 0.0–0.1)
Immature Granulocytes: 0 %
Lymphocytes Absolute: 1.8 10*3/uL (ref 0.7–3.1)
Lymphs: 27 %
MCH: 31 pg (ref 26.6–33.0)
MCHC: 34.2 g/dL (ref 31.5–35.7)
MCV: 91 fL (ref 79–97)
Monocytes Absolute: 0.6 10*3/uL (ref 0.1–0.9)
Monocytes: 9 %
Neutrophils Absolute: 4.1 10*3/uL (ref 1.4–7.0)
Neutrophils: 62 %
Platelets: 265 10*3/uL (ref 150–450)
RBC: 4.2 x10E6/uL (ref 3.77–5.28)
RDW: 13 % (ref 11.7–15.4)
WBC: 6.6 10*3/uL (ref 3.4–10.8)

## 2018-06-29 LAB — COMPREHENSIVE METABOLIC PANEL
ALT: 17 IU/L (ref 0–32)
AST: 21 IU/L (ref 0–40)
Albumin/Globulin Ratio: 1.3 (ref 1.2–2.2)
Albumin: 3.9 g/dL (ref 3.8–4.8)
Alkaline Phosphatase: 45 IU/L (ref 39–117)
BUN/Creatinine Ratio: 13 (ref 9–23)
BUN: 9 mg/dL (ref 6–20)
Bilirubin Total: <0.2 mg/dL (ref 0.0–1.2)
CO2: 22 mmol/L (ref 20–29)
Calcium: 9 mg/dL (ref 8.7–10.2)
Chloride: 97 mmol/L (ref 96–106)
Creatinine, Ser: 0.68 mg/dL (ref 0.57–1.00)
GFR calc Af Amer: 128 mL/min/{1.73_m2} (ref >59)
GFR calc non Af Amer: 111 mL/min/{1.73_m2} (ref >59)
Globulin, Total: 3.1 g/dL (ref 1.5–4.5)
Glucose: 77 mg/dL (ref 65–99)
Potassium: 4.1 mmol/L (ref 3.5–5.2)
Sodium: 134 mmol/L (ref 134–144)
Total Protein: 7 g/dL (ref 6.0–8.5)

## 2018-06-29 LAB — LIPID PANEL
Chol/HDL Ratio: 2.5 ratio (ref 0.0–4.4)
Cholesterol, Total: 172 mg/dL (ref 100–199)
HDL: 69 mg/dL (ref >39)
LDL Calculated: 92 mg/dL (ref 0–99)
Triglycerides: 55 mg/dL (ref 0–149)
VLDL Cholesterol Cal: 11 mg/dL (ref 5–40)

## 2018-06-29 LAB — VITAMIN B12: Vitamin B-12: 584 pg/mL (ref 232–1245)

## 2018-06-29 LAB — VITAMIN D 25 HYDROXY (VIT D DEFICIENCY, FRACTURES): Vit D, 25-Hydroxy: 21.1 ng/mL — ABNORMAL LOW (ref 30.0–100.0)

## 2018-06-29 LAB — TSH: TSH: 3.59 u[IU]/mL (ref 0.450–4.500)

## 2018-07-06 ENCOUNTER — Telehealth: Payer: Self-pay

## 2018-07-07 NOTE — Telephone Encounter (Signed)
Patient was giving the information for the [facility] on [address] [phone]

## 2018-07-11 ENCOUNTER — Telehealth: Payer: Self-pay

## 2018-07-11 ENCOUNTER — Other Ambulatory Visit: Payer: Self-pay

## 2018-07-11 ENCOUNTER — Ambulatory Visit (INDEPENDENT_AMBULATORY_CARE_PROVIDER_SITE_OTHER): Payer: Medicaid Other | Admitting: *Deleted

## 2018-07-11 DIAGNOSIS — Z3480 Encounter for supervision of other normal pregnancy, unspecified trimester: Secondary | ICD-10-CM | POA: Insufficient documentation

## 2018-07-11 NOTE — Telephone Encounter (Signed)
Called and spoke with patient, advised that labs were stable besides vitamin D. Asked that she take otc vitamin D supplement as directed. Patient verbalized understanding. Thanks!

## 2018-07-11 NOTE — Progress Notes (Signed)
    Virtual Visit via Telephone Note  I connected with Rachael Jones on 07/11/18 at  9:50 AM EDT by telephone and verified that I am speaking with the correct person using two identifiers.  Location: Patient: Rachael Jones Provider: Derl Barrow, RN   I discussed the limitations, risks, security and privacy concerns of performing an evaluation and management service by telephone and the availability of in person appointments. I also discussed with the patient that there may be a patient responsible charge related to this service. The patient expressed understanding and agreed to proceed.   History of Present Illness: PRENATAL INTAKE SUMMARY  Rachael Jones presents today New OB Nurse Interview.  OB History    Gravida  6   Para  5   Term  5   Preterm      AB      Living  5     SAB      TAB      Ectopic      Multiple      Live Births  5          I have reviewed the patient's medical, obstetrical, social, and family histories, medications, and available lab results.  SUBJECTIVE She has no unusual complaints   Observations/Objective: Initial nurse interview for history (New OB).  EDD: 02/02/2019 by LMP GA: [redacted]w[redacted]d G6P5005  GENERAL APPEARANCE: oriented to person, place and time, non-face to face visit.  Assessment and Plan: Normal pregnancy Prenatal care-[facility] Lab work will be completed at visit with midwife. Ultrasound <14 weeks ordered to confirm dating/viability Informed to enroll in Babyscripts  Follow Up Instructions:  I discussed the assessment and treatment plan with the patient. The patient was provided an opportunity to ask questions and all were answered. The patient agreed with the plan and demonstrated an understanding of the instructions.   The patient was advised to call back or seek an in-person evaluation if the symptoms worsen or if the condition fails to improve as anticipated.  I provided 30 minutes of non-face-to-face time  during this encounter.   Derl Barrow, RN

## 2018-07-14 ENCOUNTER — Encounter: Payer: Self-pay | Admitting: General Practice

## 2018-07-14 ENCOUNTER — Other Ambulatory Visit (HOSPITAL_COMMUNITY)
Admission: RE | Admit: 2018-07-14 | Discharge: 2018-07-14 | Disposition: A | Discharge status: Home / Self Care | Payer: Medicaid Other | Source: Ambulatory Visit | Attending: Obstetrics and Gynecology | Admitting: Obstetrics and Gynecology

## 2018-07-14 ENCOUNTER — Encounter: Payer: Self-pay | Admitting: Obstetrics and Gynecology

## 2018-07-14 ENCOUNTER — Other Ambulatory Visit: Payer: Self-pay

## 2018-07-14 ENCOUNTER — Ambulatory Visit (INDEPENDENT_AMBULATORY_CARE_PROVIDER_SITE_OTHER): Payer: Medicaid Other | Admitting: Obstetrics and Gynecology

## 2018-07-14 VITALS — BP 109/69 | HR 91 | Wt 148.0 lb

## 2018-07-14 DIAGNOSIS — Z3480 Encounter for supervision of other normal pregnancy, unspecified trimester: Secondary | ICD-10-CM | POA: Insufficient documentation

## 2018-07-14 DIAGNOSIS — O26841 Uterine size-date discrepancy, first trimester: Secondary | ICD-10-CM

## 2018-07-14 DIAGNOSIS — O26849 Uterine size-date discrepancy, unspecified trimester: Secondary | ICD-10-CM

## 2018-07-14 DIAGNOSIS — Z3A11 11 weeks gestation of pregnancy: Secondary | ICD-10-CM

## 2018-07-14 DIAGNOSIS — Z113 Encounter for screening for infections with a predominantly sexual mode of transmission: Secondary | ICD-10-CM | POA: Insufficient documentation

## 2018-07-14 DIAGNOSIS — K117 Disturbances of salivary secretion: Secondary | ICD-10-CM

## 2018-07-14 MED ORDER — GLYCOPYRROLATE 1 MG PO TABS: 1.0000 mg | ORAL_TABLET | Freq: Three times a day (TID) | ORAL | 0 refills | Status: DC

## 2018-07-14 MED ORDER — BLOOD PRESSURE MONITOR AUTOMAT DEVI: 0 refills | Status: DC

## 2018-07-14 NOTE — Progress Notes (Signed)
Subjective:    Rachael Jones is being seen today for her first obstetrical visit.  This is not a planned pregnancy. She is at 9493w0d gestation by her unsure LMP. Her obstetrical history is significant for none. Relationship with FOB (Lennis): significant other, living together. Patient does intend to breast feed. Pregnancy history fully reviewed.  Patient reports excessive spitting. "It seems like the symptoms are more than usual".  Review of Systems:   Review of Systems  Constitutional: Negative.   HENT: Negative.   Eyes: Negative.   Respiratory: Negative.   Cardiovascular: Negative.   Gastrointestinal: Positive for nausea (with increased spitting ).  Endocrine: Negative.   Genitourinary: Negative.   Musculoskeletal: Negative.   Skin: Negative.   Allergic/Immunologic: Negative.   Neurological: Negative.   Hematological: Negative.   Psychiatric/Behavioral: Negative.     Objective:     BP 109/69   Pulse 91   Wt 148 lb (67.1 kg)   LMP 04/28/2018 (Approximate)   BMI 23.18 kg/m  Physical Exam  Nursing note and vitals reviewed. Constitutional: She is oriented to person, place, and time. She appears well-developed and well-nourished.  HENT:  Head: Normocephalic and atraumatic.  Right Ear: External ear normal.  Left Ear: External ear normal.  Nose: Nose normal.  Mouth/Throat: Oropharynx is clear and moist.  Eyes: Pupils are equal, round, and reactive to light. Conjunctivae and EOM are normal.  Neck: Normal range of motion. Neck supple.  Cardiovascular: Normal rate, regular rhythm, normal heart sounds and intact distal pulses.  Respiratory: Effort normal and breath sounds normal.  GI: Soft. Bowel sounds are normal.  Genitourinary:    Vulva, vagina and uterus normal.   Musculoskeletal: Normal range of motion.  Neurological: She is alert and oriented to person, place, and time. She has normal reflexes.  Skin: Skin is warm and dry.  Psychiatric: She has a normal mood and  affect. Her behavior is normal. Judgment and thought content normal.    Maternal Exam:  Abdomen: Patient reports no abdominal tenderness. Fundal height is S>D.    Introitus: Normal vulva. Normal vagina.  Ferning test: not done.  Nitrazine test: not done. Amniotic fluid character: not assessed.  Pelvis: adequate for delivery.   Cervix: Cervix evaluated by sterile speculum exam and digital exam.    Patient informed that the ultrasound is considered a limited OB ultrasound and is not intended to be a complete ultrasound exam.  Patient also informed that the ultrasound is not being completed with the intent of assessing for fetal or placental anomalies or any pelvic abnormalities.  Explained that the purpose of today's ultrasound is to assess for  ~GA/size. Fetus appears to be 14-15 wks in size, but unable to obtain CRL.  Patient acknowledges the purpose of the exam and the limitations of the study.     Assessment:    Pregnancy: [id number] Patient Active Problem List   Diagnosis Date Noted  . Supervision of other normal pregnancy, antepartum 07/11/2018  . [redacted] weeks gestation of pregnancy 06/29/2018  . Risk for sexually transmitted disease 11/06/2016  . Depression, major, single episode, moderate (HCC) 03/18/2014  . Alcohol abuse 03/18/2014  . Overdose 03/18/2014  . Alcohol intoxication (HCC)    OB History  Gravida Para Term Preterm AB Living  6 5 5     5   SAB TAB Ectopic Multiple Live Births          5    # Outcome Date GA Lbr Len/2nd Weight Sex Delivery Anes PTL Lv  6 Current           5 Term 11/01/08 [redacted]w[redacted]d   M Vag-Spont  N LIV  4 Term 01/01/06 [redacted]w[redacted]d   M Vag-Spont  N LIV  3 Term 10/03/03 [redacted]w[redacted]d   F Vag-Spont  N LIV  2 Term 01/15/02 [redacted]w[redacted]d   M Vag-Spont  N LIV  1 Term 08/19/00 [redacted]w[redacted]d   M Vag-Spont  N LIV      Plan:  1. Uterine size-date discrepancy, antepartum - OB U/S complete <14 wks  scheduled for 07/20/18   2. Supervision of other normal pregnancy, antepartum  - Obstetric  Panel, Including HIV, Culture, OB Urine,  - Blood Pressure Monitoring (BLOOD PRESSURE MONITOR AUTOMAT) DEVI, - Genetic Screening,  - Cervicovaginal ancillary only( [place])  3. Screen for STD (sexually transmitted disease)  - Cervicovaginal ancillary only( [place])  4. Ptyalism  - Rx for glycopyrrolate (ROBINUL) 1 MG tablet   Initial labs drawn. Panorama drawn. Prenatal vitamins. Problem list reviewed and updated. AFP3 discussed: undecided. Role of ultrasound in pregnancy discussed; fetal survey: ordered. Amniocentesis discussed: not indicated. The nature of [place] with multiple MDs and other Advanced Practice Providers was explained to patient; also emphasized that residents, students are part of our team.  Discussed optimized OB schedule with visits via My Chart video. Advised that can have an in-person visit if she thinks she needs to be seen during her visit. Follow up in 9 weeks via My Chart video. 50% of 40 min visit spent on counseling and coordination of care.     Laury Deep, MSN, CNM 07/14/2018

## 2018-07-15 LAB — OBSTETRIC PANEL, INCLUDING HIV
Antibody Screen: NEGATIVE
Basophils Absolute: 0 10*3/uL (ref 0.0–0.2)
Basos: 0 %
EOS (ABSOLUTE): 0.1 10*3/uL (ref 0.0–0.4)
Eos: 2 %
HIV Screen 4th Generation wRfx: NONREACTIVE
Hematocrit: 36.5 % (ref 34.0–46.6)
Hemoglobin: 12.1 g/dL (ref 11.1–15.9)
Hepatitis B Surface Ag: NEGATIVE
Immature Grans (Abs): 0 10*3/uL (ref 0.0–0.1)
Immature Granulocytes: 0 %
Lymphocytes Absolute: 1.4 10*3/uL (ref 0.7–3.1)
Lymphs: 22 %
MCH: 30.9 pg (ref 26.6–33.0)
MCHC: 33.2 g/dL (ref 31.5–35.7)
MCV: 93 fL (ref 79–97)
Monocytes Absolute: 0.6 10*3/uL (ref 0.1–0.9)
Monocytes: 9 %
Neutrophils Absolute: 4.3 10*3/uL (ref 1.4–7.0)
Neutrophils: 67 %
Platelets: 229 10*3/uL (ref 150–450)
RBC: 3.91 x10E6/uL (ref 3.77–5.28)
RDW: 13.2 % (ref 11.7–15.4)
RPR Ser Ql: NONREACTIVE
Rh Factor: POSITIVE
Rubella Antibodies, IGG: 1.6 index (ref >0.99)
WBC: 6.4 10*3/uL (ref 3.4–10.8)

## 2018-07-16 LAB — CULTURE, OB URINE

## 2018-07-16 LAB — URINE CULTURE, OB REFLEX: Organism ID, Bacteria: NO GROWTH

## 2018-07-18 LAB — CERVICOVAGINAL ANCILLARY ONLY
Bacterial vaginitis: POSITIVE — AB
Candida vaginitis: POSITIVE — AB
Chlamydia: NEGATIVE
Neisseria Gonorrhea: NEGATIVE
Trichomonas: NEGATIVE

## 2018-07-20 ENCOUNTER — Other Ambulatory Visit: Payer: Self-pay

## 2018-07-20 ENCOUNTER — Ambulatory Visit (HOSPITAL_COMMUNITY)
Admission: RE | Admit: 2018-07-20 | Discharge: 2018-07-20 | Disposition: A | Discharge status: Home / Self Care | Payer: Medicaid Other | Source: Ambulatory Visit | Attending: Obstetrics and Gynecology | Admitting: Obstetrics and Gynecology

## 2018-07-20 ENCOUNTER — Telehealth: Payer: Self-pay | Admitting: *Deleted

## 2018-07-20 ENCOUNTER — Other Ambulatory Visit: Payer: Self-pay | Admitting: Obstetrics and Gynecology

## 2018-07-20 DIAGNOSIS — Z3480 Encounter for supervision of other normal pregnancy, unspecified trimester: Secondary | ICD-10-CM | POA: Diagnosis present

## 2018-07-20 DIAGNOSIS — B373 Candidiasis of vulva and vagina: Secondary | ICD-10-CM

## 2018-07-20 DIAGNOSIS — B3731 Acute candidiasis of vulva and vagina: Secondary | ICD-10-CM

## 2018-07-20 DIAGNOSIS — B9689 Other specified bacterial agents as the cause of diseases classified elsewhere: Secondary | ICD-10-CM

## 2018-07-20 MED ORDER — METRONIDAZOLE 500 MG PO TABS: 500.0000 mg | ORAL_TABLET | Freq: Two times a day (BID) | ORAL | 0 refills | Status: DC

## 2018-07-20 MED ORDER — TERCONAZOLE 0.4 % VA CREA: 1.0000 | TOPICAL_CREAM | Freq: Every day | VAGINAL | 0 refills | Status: DC

## 2018-07-20 NOTE — Telephone Encounter (Signed)
-----   Message from Laury Deep, [place] sent at 07/19/2018  6:43 PM EDT ----- Tx for BV & yeast

## 2018-07-22 ENCOUNTER — Encounter: Payer: Self-pay | Admitting: General Practice

## 2018-08-11 ENCOUNTER — Telehealth: Payer: Self-pay | Admitting: *Deleted

## 2018-08-11 ENCOUNTER — Other Ambulatory Visit: Payer: Self-pay | Admitting: Obstetrics and Gynecology

## 2018-08-11 NOTE — Telephone Encounter (Signed)
Babyscripts called to report BP 113/90 and recheck of 120/73. Will send Mychart message to patient.  Derl Barrow, RN

## 2018-09-15 ENCOUNTER — Telehealth (INDEPENDENT_AMBULATORY_CARE_PROVIDER_SITE_OTHER): Payer: Medicaid Other | Admitting: Obstetrics and Gynecology

## 2018-09-15 ENCOUNTER — Encounter: Payer: Self-pay | Admitting: Obstetrics and Gynecology

## 2018-09-15 DIAGNOSIS — Z3480 Encounter for supervision of other normal pregnancy, unspecified trimester: Secondary | ICD-10-CM

## 2018-09-15 DIAGNOSIS — B3731 Acute candidiasis of vulva and vagina: Secondary | ICD-10-CM

## 2018-09-15 DIAGNOSIS — Z3A21 21 weeks gestation of pregnancy: Secondary | ICD-10-CM

## 2018-09-15 DIAGNOSIS — Z3482 Encounter for supervision of other normal pregnancy, second trimester: Secondary | ICD-10-CM

## 2018-09-15 DIAGNOSIS — B373 Candidiasis of vulva and vagina: Secondary | ICD-10-CM

## 2018-09-15 MED ORDER — TERCONAZOLE 0.4 % VA CREA: 1.0000 | TOPICAL_CREAM | Freq: Every day | VAGINAL | 0 refills | Status: DC

## 2018-09-15 NOTE — Progress Notes (Signed)
   MY CHART VIDEO VIRTUAL OBSTETRICS VISIT ENCOUNTER NOTE  I connected with Rachael Jones on 09/17/18 at  1:10 PM EDT by My Chart video at home and verified that I am speaking with the correct person using two identifiers.   I discussed the limitations, risks, security and privacy concerns of performing an evaluation and management service by My Chart video and the availability of in person appointments. I also discussed with the patient that there may be a patient responsible charge related to this service. The patient expressed understanding and agreed to proceed.  Subjective:  Rachael Jones is a 38 y.o. G6P5005 at [redacted]w[redacted]d being followed for ongoing prenatal care.  She is currently monitored for the following issues for this low-risk pregnancy and has Depression, major, single episode, moderate ([place]); Alcohol abuse; Overdose; Alcohol intoxication ([place]); Risk for sexually transmitted disease; [redacted] weeks gestation of pregnancy; and Supervision of other normal pregnancy, antepartum on their problem list.  Patient reports vaginal irritation. She states she "feels a burning sensation for about 5 minutes after using Angel Soft tissue." She switched to [place] tissue and "it is getting better". She also did not finish taking Flagyl. She reports that chewing Extra gum helps ptyalism. Reports fetal movement. Denies any contractions, bleeding or leaking of fluid.   The following portions of the patient's history were reviewed and updated as appropriate: allergies, current medications, past family history, past medical history, past social history, past surgical history and problem list.   Objective:   General:  Alert, oriented and cooperative.   Mental Status: Normal mood and affect perceived. Normal judgment and thought content.  Rest of physical exam deferred due to type of encounter  BP 115/69   Pulse 94   LMP 04/28/2018 (Approximate)  **Done by patient's own at home BP cuff   Assessment and Plan:   Pregnancy: X3G1829 at [redacted]w[redacted]d  1. Supervision of other normal pregnancy, antepartum - Discussed normal prenatal labs - Patient eager to have anatomy U/S  FOB wants to know, but she does not gender - [place] MFM OB COMP + 14 WK; Future  Preterm labor symptoms and general obstetric precautions including but not limited to vaginal bleeding, contractions, leaking of fluid and fetal movement were reviewed in detail with the patient.  I discussed the assessment and treatment plan with the patient. The patient was provided an opportunity to ask questions and all were answered. The patient agreed with the plan and demonstrated an understanding of the instructions. The patient was advised to call back or seek an in-person office evaluation/go to [facility] at [facility] for any urgent or concerning symptoms. Please refer to After Visit Summary for other counseling recommendations.   I provided 10 minutes of non-face-to-face time during this encounter. There was 5 minutes of chart review time spent prior to this encounter. Total time spent = 15 minutes.  Return in about 6 weeks (around 10/27/2018) for Return OB 2hr GTT.  Future Appointments  Date Time Provider [place]  09/26/2018  1:30 PM [facility] [place] 1 [facility]-M[facility]US [facility]  10/26/2018  8:10 AM Tre[place] Mall, CNM [facility] None  01/09/2019  2:00 PM Azzie Glatter, FNP [facility] None    Laury Deep, [place] for [employer], [place]

## 2018-09-17 ENCOUNTER — Encounter: Payer: Self-pay | Admitting: Obstetrics and Gynecology

## 2018-09-23 ENCOUNTER — Encounter: Payer: Self-pay | Admitting: General Practice

## 2018-09-26 ENCOUNTER — Other Ambulatory Visit (HOSPITAL_COMMUNITY): Payer: Self-pay | Admitting: *Deleted

## 2018-09-26 ENCOUNTER — Ambulatory Visit (HOSPITAL_COMMUNITY)
Admission: RE | Admit: 2018-09-26 | Discharge: 2018-09-26 | Disposition: A | Discharge status: Home / Self Care | Payer: Medicaid Other | Source: Ambulatory Visit | Attending: Obstetrics and Gynecology | Admitting: Obstetrics and Gynecology

## 2018-09-26 ENCOUNTER — Other Ambulatory Visit: Payer: Self-pay

## 2018-09-26 DIAGNOSIS — Z362 Encounter for other antenatal screening follow-up: Secondary | ICD-10-CM

## 2018-09-26 DIAGNOSIS — O09522 Supervision of elderly multigravida, second trimester: Secondary | ICD-10-CM | POA: Diagnosis not present

## 2018-09-26 DIAGNOSIS — Z3A23 23 weeks gestation of pregnancy: Secondary | ICD-10-CM

## 2018-09-26 DIAGNOSIS — Z363 Encounter for antenatal screening for malformations: Secondary | ICD-10-CM | POA: Diagnosis not present

## 2018-09-26 DIAGNOSIS — Z3480 Encounter for supervision of other normal pregnancy, unspecified trimester: Secondary | ICD-10-CM | POA: Diagnosis present

## 2018-10-19 ENCOUNTER — Ambulatory Visit (INDEPENDENT_AMBULATORY_CARE_PROVIDER_SITE_OTHER): Payer: Medicaid Other | Admitting: Certified Nurse Midwife

## 2018-10-19 ENCOUNTER — Encounter: Payer: Self-pay | Admitting: General Practice

## 2018-10-19 ENCOUNTER — Other Ambulatory Visit: Payer: Self-pay | Admitting: Certified Nurse Midwife

## 2018-10-19 ENCOUNTER — Encounter: Payer: Self-pay | Admitting: Certified Nurse Midwife

## 2018-10-19 ENCOUNTER — Other Ambulatory Visit: Payer: Self-pay

## 2018-10-19 VITALS — BP 111/62 | HR 72 | Wt 177.0 lb

## 2018-10-19 DIAGNOSIS — Z3A26 26 weeks gestation of pregnancy: Secondary | ICD-10-CM

## 2018-10-19 DIAGNOSIS — Z3480 Encounter for supervision of other normal pregnancy, unspecified trimester: Secondary | ICD-10-CM

## 2018-10-19 DIAGNOSIS — O09522 Supervision of elderly multigravida, second trimester: Secondary | ICD-10-CM

## 2018-10-19 DIAGNOSIS — D509 Iron deficiency anemia, unspecified: Secondary | ICD-10-CM

## 2018-10-19 DIAGNOSIS — Z3482 Encounter for supervision of other normal pregnancy, second trimester: Secondary | ICD-10-CM | POA: Diagnosis not present

## 2018-10-19 DIAGNOSIS — O09529 Supervision of elderly multigravida, unspecified trimester: Secondary | ICD-10-CM | POA: Insufficient documentation

## 2018-10-19 NOTE — Progress Notes (Signed)
   PRENATAL VISIT NOTE  Subjective:  Rachael Jones is a 38 y.o. G6P5005 at [redacted]w[redacted]d being seen today for ongoing prenatal care.  She is currently monitored for the following issues for this low-risk pregnancy and has Depression, major, single episode, moderate ([place]); Alcohol abuse; Overdose; Alcohol intoxication ([place]); Risk for sexually transmitted disease; [redacted] weeks gestation of pregnancy; Supervision of other normal pregnancy, antepartum; and AMA (advanced maternal age) multigravida 35+ on their problem list.  Patient reports no complaints.  Contractions: Not present.  .  Movement: Present. Denies leaking of fluid.   The following portions of the patient's history were reviewed and updated as appropriate: allergies, current medications, past family history, past medical history, past social history, past surgical history and problem list.   Objective:   Vitals:   10/19/18 0906  BP: 111/62  Pulse: 72  Weight: 177 lb (80.3 kg)    Fetal Status: Fetal Heart Rate (bpm): 154 Fundal Height: 28 cm Movement: Present     General:  Alert, oriented and cooperative. Patient is in no acute distress.  Skin: Skin is warm and dry. No rash noted.   Cardiovascular: Normal heart rate noted  Respiratory: Normal respiratory effort, no problems with respiration noted  Abdomen: Soft, gravid, appropriate for gestational age.  Pain/Pressure: Absent     Pelvic: Cervical exam deferred        Extremities: Normal range of motion.  Edema: None  Mental Status: Normal mood and affect. Normal behavior. Normal judgment and thought content.   Assessment and Plan:  Pregnancy: [id number] at [redacted]w[redacted]d 1. Supervision of other normal pregnancy, antepartum - Patient doing well, no complaints - Anticipatory guidance on upcoming appointments including MyChart appointment around 32 weeks and in person at 7 weeks  - Educated and discussed birth control options in detail, patient is considering Nexplanon for contraception  - HIV Antibody  (routine testing w rflx) - RPR - CBC - Glucose Tolerance, 2 Hours w/1 Hour  2. Antepartum multigravida of advanced maternal age - 38 yo at time of delivery, no antenatal screening needed   Preterm labor symptoms and general obstetric precautions including but not limited to vaginal bleeding, contractions, leaking of fluid and fetal movement were reviewed in detail with the patient. Please refer to After Visit Summary for other counseling recommendations.   Return in about 6 weeks (around 11/28/2018) for ROB-mychart .  Future Appointments  Date Time Provider [place]  10/24/2018 12:45 PM [place] 2 [facility]  11/30/2018  2:10 PM Laury Deep, CNM [facility] None  12/28/2018  9:10 AM Laury Deep, CNM [facility] None  01/09/2019  2:00 PM Azzie Glatter, [place] None    Lajean Manes, CNM

## 2018-10-19 NOTE — Patient Instructions (Addendum)
Contraception Choices Contraception, also called birth control, means things to use or ways to try not to get pregnant. Hormonal birth control This kind of birth control uses hormones. Here are some types of hormonal birth control:  A tube that is put under skin of the arm (implant). The tube can stay in for as long as 3 years.  Shots to get every 3 months (injections).  Pills to take every day (birth control pills).  A patch to change 1 time each week for 3 weeks (birth control patch). After that, the patch is taken off for 1 week.  A ring to put in the vagina. The ring is left in for 3 weeks. Then it is taken out of the vagina for 1 week. Then a new ring is put in.  Pills to take after unprotected sex (emergency birth control pills). Barrier birth control Here are some types of barrier birth control:  A thin covering that is put on the penis before sex (female condom). The covering is thrown away after sex.  A soft, loose covering that is put in the vagina before sex (female condom). The covering is thrown away after sex.  A rubber bowl that sits over the cervix (diaphragm). The bowl must be made for you. The bowl is put into the vagina before sex. The bowl is left in for 6-8 hours after sex. It is taken out within 24 hours.  A small, soft cup that fits over the cervix (cervical cap). The cup must be made for you. The cup can be left in for 6-8 hours after sex. It is taken out within 48 hours.  A sponge that is put into the vagina before sex. It must be left in for at least 6 hours after sex. It must be taken out within 30 hours. Then it is thrown away.  A chemical that kills or stops sperm from getting into the uterus (spermicide). It may be a pill, cream, jelly, or foam to put in the vagina. The chemical should be used at least 10-15 minutes before sex. IUD (intrauterine) birth control An IUD is a small, T-shaped piece of plastic. It is put inside the uterus. There are two kinds:   Hormone IUD. This kind can stay in for 3-5 years.  Copper IUD. This kind can stay in for 10 years. Permanent birth control Here are some types of permanent birth control:  Surgery to block the fallopian tubes.  Having an insert put into each fallopian tube.  Surgery to tie off the tubes that carry sperm (vasectomy). Natural planning birth control Here are some types of natural planning birth control:  Not having sex on the days the woman could get pregnant.  Using a calendar: ? To keep track of the length of each period. ? To find out what days pregnancy can happen. ? To plan to not have sex on days when pregnancy can happen.  Watching for symptoms of ovulation and not having sex during ovulation. One way the woman can check for ovulation is to check her temperature.  Waiting to have sex until after ovulation. Summary  Contraception, also called birth control, means things to use or ways to try not to get pregnant.  Hormonal methods of birth control include implants, injections, pills, patches, vaginal rings, and emergency birth control pills.  Barrier methods of birth control can include female condoms, female condoms, diaphragms, cervical caps, sponges, and spermicides.  There are two types of IUD (intrauterine device) birth control.  An IUD can be put in a woman's uterus to prevent pregnancy for 3-5 years.  Permanent sterilization can be done through a procedure for males, females, or both.  Natural planning methods involve not having sex on the days when the woman could get pregnant. This information is not intended to replace advice given to you by your health care provider. Make sure you discuss any questions you have with your health care provider. Document Released: 11/16/2008 Document Revised: 05/11/2018 Document Reviewed: 01/30/2016 Elsevier Patient Education  2020 Elsevier Inc.   Glucose Tolerance Test During Pregnancy Why am I having this test? The glucose  tolerance test (GTT) is done to check how your body processes sugar (glucose). This is one of several tests used to diagnose diabetes that develops during pregnancy (gestational diabetes mellitus). Gestational diabetes is a temporary form of diabetes that some women develop during pregnancy. It usually occurs during the second trimester of pregnancy and goes away after delivery. Testing (screening) for gestational diabetes usually occurs between 24 and 28 weeks of pregnancy. You may have the GTT test after having a 1-hour glucose screening test if the results from that test indicate that you may have gestational diabetes. You may also have this test if:  You have a history of gestational diabetes.  You have a history of giving birth to very large babies or have experienced repeated fetal loss (stillbirth).  You have signs and symptoms of diabetes, such as: ? Changes in your vision. ? Tingling or numbness in your hands or feet. ? Changes in hunger, thirst, and urination that are not otherwise explained by your pregnancy. What is being tested? This test measures the amount of glucose in your blood at different times during a period of 3 hours. This indicates how well your body is able to process glucose. What kind of sample is taken?  Blood samples are required for this test. They are usually collected by inserting a needle into a blood vessel. How do I prepare for this test?  For 3 days before your test, eat normally. Have plenty of carbohydrate-rich foods.  Follow instructions from your health care provider about: ? Eating or drinking restrictions on the day of the test. You may be asked to not eat or drink anything other than water (fast) starting 8-10 hours before the test. ? Changing or stopping your regular medicines. Some medicines may interfere with this test. Tell a health care provider about:  All medicines you are taking, including vitamins, herbs, eye drops, creams, and  over-the-counter medicines.  Any blood disorders you have.  Any surgeries you have had.  Any medical conditions you have. What happens during the test? First, your blood glucose will be measured. This is referred to as your fasting blood glucose, since you fasted before the test. Then, you will drink a glucose solution that contains a certain amount of glucose. Your blood glucose will be measured again 1, 2, and 3 hours after drinking the solution. This test takes about 3 hours to complete. You will need to stay at the testing location during this time. During the testing period:  Do not eat or drink anything other than the glucose solution.  Do not exercise.  Do not use any products that contain nicotine or tobacco, such as cigarettes and e-cigarettes. If you need help stopping, ask your health care provider. The testing procedure may vary among health care providers and hospitals. How are the results reported? Your results will be reported as milligrams of  glucose per deciliter of blood (mg/dL) or millimoles per liter (mmol/L). Your health care provider will compare your results to normal ranges that were established after testing a large group of people (reference ranges). Reference ranges may vary among labs and hospitals. For this test, common reference ranges are:  Fasting: less than 95-105 mg/dL (6.0-4.55.3-5.8 mmol/L).  1 hour after drinking glucose: less than 180-190 mg/dL (40.9-81.110.0-10.5 mmol/L).  2 hours after drinking glucose: less than 155-165 mg/dL (9.1-4.78.6-9.2 mmol/L).  3 hours after drinking glucose: 140-145 mg/dL (8.2-9.57.8-8.1 mmol/L). What do the results mean? Results within reference ranges are considered normal, meaning that your glucose levels are well-controlled. If two or more of your blood glucose levels are high, you may be diagnosed with gestational diabetes. If only one level is high, your health care provider may suggest repeat testing or other tests to confirm a diagnosis. Talk  with your health care provider about what your results mean. Questions to ask your health care provider Ask your health care provider, or the department that is doing the test:  When will my results be ready?  How will I get my results?  What are my treatment options?  What other tests do I need?  What are my next steps? Summary  The glucose tolerance test (GTT) is one of several tests used to diagnose diabetes that develops during pregnancy (gestational diabetes mellitus). Gestational diabetes is a temporary form of diabetes that some women develop during pregnancy.  You may have the GTT test after having a 1-hour glucose screening test if the results from that test indicate that you may have gestational diabetes. You may also have this test if you have any symptoms or risk factors for gestational diabetes.  Talk with your health care provider about what your results mean. This information is not intended to replace advice given to you by your health care provider. Make sure you discuss any questions you have with your health care provider. Document Released: 07/21/2011 Document Revised: 05/12/2018 Document Reviewed: 08/31/2016 Elsevier Patient Education  2020 [employer]   Third Trimester of Pregnancy  The third trimester is from week 28 through week 40 (months 7 through 9). This trimester is when your unborn baby (fetus) is growing very fast. At the end of the ninth month, the unborn baby is about 20 inches in length. It weighs about 6-10 pounds. Follow these instructions at home: Medicines  Take over-the-counter and prescription medicines only as told by your doctor. Some medicines are safe and some medicines are not safe during pregnancy.  Take a prenatal vitamin that contains at least 600 micrograms (mcg) of folic acid.  If you have trouble pooping (constipation), take medicine that will make your stool soft (stool softener) if your doctor approves. Eating and drinking    Eat regular, healthy meals.  Avoid raw meat and uncooked cheese.  If you get low calcium from the food you eat, talk to your doctor about taking a daily calcium supplement.  Eat four or five small meals rather than three large meals a day.  Avoid foods that are high in fat and sugars, such as fried and sweet foods.  To prevent constipation: ? Eat foods that are high in fiber, like fresh fruits and vegetables, whole grains, and beans. ? Drink enough fluids to keep your pee (urine) clear or pale yellow. Activity  Exercise only as told by your doctor. Stop exercising if you start to have cramps.  Avoid heavy lifting, wear low heels, and sit  up straight.  Do not exercise if it is too hot, too humid, or if you are in a place of great height (high altitude).  You may continue to have sex unless your doctor tells you not to. Relieving pain and discomfort  Wear a good support bra if your breasts are tender.  Take frequent breaks and rest with your legs raised if you have leg cramps or low back pain.  Take warm water baths (sitz baths) to soothe pain or discomfort caused by hemorrhoids. Use hemorrhoid cream if your doctor approves.  If you develop puffy, bulging veins (varicose veins) in your legs: ? Wear support hose or compression stockings as told by your doctor. ? Raise (elevate) your feet for 15 minutes, 3-4 times a day. ? Limit salt in your food. Safety  Wear your seat belt when driving.  Make a list of emergency phone numbers, including numbers for family, friends, the hospital, and police and fire departments. Preparing for your baby's arrival To prepare for the arrival of your baby:  Take prenatal classes.  Practice driving to the hospital.  Visit the hospital and tour the maternity area.  Talk to your work about taking leave once the baby comes.  Pack your hospital bag.  Prepare the baby's room.  Go to your doctor visits.  Buy a rear-facing car seat. Learn  how to install it in your car. General instructions  Do not use hot tubs, steam rooms, or saunas.  Do not use any products that contain nicotine or tobacco, such as cigarettes and e-cigarettes. If you need help quitting, ask your doctor.  Do not drink alcohol.  Do not douche or use tampons or scented sanitary pads.  Do not cross your legs for long periods of time.  Do not travel for long distances unless you must. Only do so if your doctor says it is okay.  Visit your dentist if you have not gone during your pregnancy. Use a soft toothbrush to brush your teeth. Be gentle when you floss.  Avoid cat litter boxes and soil used by cats. These carry germs that can cause birth defects in the baby and can cause a loss of your baby (miscarriage) or stillbirth.  Keep all your prenatal visits as told by your doctor. This is important. Contact a doctor if:  You are not sure if you are in labor or if your water has broken.  You are dizzy.  You have mild cramps or pressure in your lower belly.  You have a nagging pain in your belly area.  You continue to feel sick to your stomach, you throw up, or you have watery poop.  You have bad smelling fluid coming from your vagina.  You have pain when you pee. Get help right away if:  You have a fever.  You are leaking fluid from your vagina.  You are spotting or bleeding from your vagina.  You have severe belly cramps or pain.  You lose or gain weight quickly.  You have trouble catching your breath and have chest pain.  You notice sudden or extreme puffiness (swelling) of your face, hands, ankles, feet, or legs.  You have not felt the baby move in over an hour.  You have severe headaches that do not go away with medicine.  You have trouble seeing.  You are leaking, or you are having a gush of fluid, from your vagina before you are 37 weeks.  You have regular belly spasms (contractions) before you  are 37 weeks. Summary  The  third trimester is from week 28 through week 40 (months 7 through 9). This time is when your unborn baby is growing very fast.  Follow your doctor's advice about medicine, food, and activity.  Get ready for the arrival of your baby by taking prenatal classes, getting all the baby items ready, preparing the baby's room, and visiting your doctor to be checked.  Get help right away if you are bleeding from your vagina, or you have chest pain and trouble catching your breath, or if you have not felt your baby move in over an hour. This information is not intended to replace advice given to you by your health care provider. Make sure you discuss any questions you have with your health care provider. Document Released: 04/15/2009 Document Revised: 05/12/2018 Document Reviewed: 02/25/2016 Elsevier Patient Education  2020 [employer].

## 2018-10-19 NOTE — Progress Notes (Signed)
Decline flu and tdap 

## 2018-10-19 NOTE — Telephone Encounter (Signed)
Patient was seen today for an appointment.

## 2018-10-20 ENCOUNTER — Encounter: Payer: Self-pay | Admitting: Certified Nurse Midwife

## 2018-10-20 DIAGNOSIS — O99019 Anemia complicating pregnancy, unspecified trimester: Secondary | ICD-10-CM | POA: Insufficient documentation

## 2018-10-20 DIAGNOSIS — D509 Iron deficiency anemia, unspecified: Secondary | ICD-10-CM | POA: Insufficient documentation

## 2018-10-20 LAB — CBC
Hematocrit: 28.7 % — ABNORMAL LOW (ref 34.0–46.6)
Hemoglobin: 9.6 g/dL — ABNORMAL LOW (ref 11.1–15.9)
MCH: 30.4 pg (ref 26.6–33.0)
MCHC: 33.4 g/dL (ref 31.5–35.7)
MCV: 91 fL (ref 79–97)
Platelets: 250 10*3/uL (ref 150–450)
RBC: 3.16 x10E6/uL — ABNORMAL LOW (ref 3.77–5.28)
RDW: 12 % (ref 11.7–15.4)
WBC: 8.6 10*3/uL (ref 3.4–10.8)

## 2018-10-20 LAB — GLUCOSE TOLERANCE, 2 HOURS W/ 1HR
Glucose, 1 hour: 94 mg/dL (ref 65–179)
Glucose, 2 hour: 67 mg/dL (ref 65–152)
Glucose, Fasting: 70 mg/dL (ref 65–91)

## 2018-10-20 LAB — HIV ANTIBODY (ROUTINE TESTING W REFLEX): HIV Screen 4th Generation wRfx: NONREACTIVE

## 2018-10-20 LAB — RPR: RPR Ser Ql: NONREACTIVE

## 2018-10-20 MED ORDER — INTEGRA F 125-1 MG PO CAPS: 1.0000 | ORAL_CAPSULE | Freq: Every day | ORAL | 2 refills | Status: DC

## 2018-10-20 NOTE — Addendum Note (Signed)
Addended by: Lajean Manes on: 10/20/2018 02:15 PM   Modules accepted: Orders

## 2018-10-24 ENCOUNTER — Other Ambulatory Visit: Payer: Self-pay | Admitting: *Deleted

## 2018-10-24 ENCOUNTER — Ambulatory Visit (HOSPITAL_COMMUNITY)
Admission: RE | Admit: 2018-10-24 | Discharge: 2018-10-24 | Disposition: A | Discharge status: Home / Self Care | Payer: Medicaid Other | Source: Ambulatory Visit | Attending: Obstetrics and Gynecology | Admitting: Obstetrics and Gynecology

## 2018-10-24 ENCOUNTER — Other Ambulatory Visit: Payer: Self-pay

## 2018-10-24 DIAGNOSIS — Z3A27 27 weeks gestation of pregnancy: Secondary | ICD-10-CM

## 2018-10-24 DIAGNOSIS — O09522 Supervision of elderly multigravida, second trimester: Secondary | ICD-10-CM

## 2018-10-24 DIAGNOSIS — Z362 Encounter for other antenatal screening follow-up: Secondary | ICD-10-CM

## 2018-10-24 DIAGNOSIS — D509 Iron deficiency anemia, unspecified: Secondary | ICD-10-CM

## 2018-10-24 DIAGNOSIS — O99019 Anemia complicating pregnancy, unspecified trimester: Secondary | ICD-10-CM

## 2018-10-24 MED ORDER — INTEGRA F 125-1 MG PO CAPS: 1.0000 | ORAL_CAPSULE | Freq: Every day | ORAL | 2 refills | Status: DC

## 2018-10-26 ENCOUNTER — Encounter: Payer: Medicaid Other | Admitting: Advanced Practice Midwife

## 2018-11-07 ENCOUNTER — Other Ambulatory Visit (HOSPITAL_COMMUNITY)
Admission: RE | Admit: 2018-11-07 | Discharge: 2018-11-07 | Disposition: A | Discharge status: Home / Self Care | Payer: Medicaid Other | Source: Ambulatory Visit | Attending: Obstetrics and Gynecology | Admitting: Obstetrics and Gynecology

## 2018-11-07 ENCOUNTER — Other Ambulatory Visit: Payer: Self-pay

## 2018-11-07 ENCOUNTER — Ambulatory Visit (INDEPENDENT_AMBULATORY_CARE_PROVIDER_SITE_OTHER): Payer: Medicaid Other | Admitting: *Deleted

## 2018-11-07 VITALS — BP 115/57 | HR 109 | Temp 98.4°F | Wt 182.0 lb

## 2018-11-07 DIAGNOSIS — N898 Other specified noninflammatory disorders of vagina: Secondary | ICD-10-CM | POA: Diagnosis present

## 2018-11-07 DIAGNOSIS — O26899 Other specified pregnancy related conditions, unspecified trimester: Secondary | ICD-10-CM

## 2018-11-07 NOTE — Progress Notes (Signed)
   SUBJECTIVE:  38 y.o. female complains of green/yellowish, thick, cottage cheese like and itchy vaginal discharge for 5 day(s). Denies abnormal vaginal bleeding or significant pelvic pain or fever. No UTI symptoms. Denies history of known exposure to STD.  Patient's last menstrual period was 04/28/2018 (approximate).  OBJECTIVE:  She appears well, afebrile. Urine dipstick: not done.  ASSESSMENT:  Vaginal Discharge  Vaginal Itchy   PLAN:  GC, chlamydia, trichomonas, BVAG, CVAG probe sent to lab. Treatment: To be determined once lab results are received ROV prn if symptoms persist or worsen.  Derl Barrow, RN

## 2018-11-14 DIAGNOSIS — B3731 Acute candidiasis of vulva and vagina: Secondary | ICD-10-CM

## 2018-11-14 DIAGNOSIS — B9689 Other specified bacterial agents as the cause of diseases classified elsewhere: Secondary | ICD-10-CM

## 2018-11-14 DIAGNOSIS — B373 Candidiasis of vulva and vagina: Secondary | ICD-10-CM

## 2018-11-14 LAB — CERVICOVAGINAL ANCILLARY ONLY
Bacterial Vaginitis (gardnerella): POSITIVE — AB
Candida Glabrata: NEGATIVE
Candida Vaginitis: POSITIVE — AB
Chlamydia: NEGATIVE
Comment: NEGATIVE
Comment: NEGATIVE
Comment: NEGATIVE
Neisseria Gonorrhea: NEGATIVE
Trichomonas: NEGATIVE

## 2018-11-15 MED ORDER — METRONIDAZOLE 500 MG PO TABS: 500.0000 mg | ORAL_TABLET | Freq: Two times a day (BID) | ORAL | 0 refills | Status: DC

## 2018-11-15 NOTE — Telephone Encounter (Signed)
-----   Message from Laury Deep, [place] sent at 11/14/2018  4:59 PM EDT ----- Treat for BV then yeast

## 2018-11-16 MED ORDER — FLUCONAZOLE 150 MG PO TABS: 150.0000 mg | ORAL_TABLET | Freq: Once | ORAL | 0 refills | Status: AC

## 2018-11-16 NOTE — Addendum Note (Signed)
Addended by: Derl Barrow on: 11/16/2018 03:24 PM   Modules accepted: Orders

## 2018-11-30 ENCOUNTER — Encounter: Payer: Self-pay | Admitting: Obstetrics and Gynecology

## 2018-11-30 ENCOUNTER — Telehealth (INDEPENDENT_AMBULATORY_CARE_PROVIDER_SITE_OTHER): Payer: Medicaid Other | Admitting: Obstetrics and Gynecology

## 2018-11-30 ENCOUNTER — Other Ambulatory Visit: Payer: Self-pay

## 2018-11-30 VITALS — BP 113/81 | HR 94

## 2018-11-30 DIAGNOSIS — O09523 Supervision of elderly multigravida, third trimester: Secondary | ICD-10-CM

## 2018-11-30 DIAGNOSIS — O99013 Anemia complicating pregnancy, third trimester: Secondary | ICD-10-CM

## 2018-11-30 DIAGNOSIS — D509 Iron deficiency anemia, unspecified: Secondary | ICD-10-CM

## 2018-11-30 DIAGNOSIS — Z3A32 32 weeks gestation of pregnancy: Secondary | ICD-10-CM

## 2018-11-30 DIAGNOSIS — Z3480 Encounter for supervision of other normal pregnancy, unspecified trimester: Secondary | ICD-10-CM

## 2018-11-30 DIAGNOSIS — O09529 Supervision of elderly multigravida, unspecified trimester: Secondary | ICD-10-CM

## 2018-11-30 NOTE — Progress Notes (Signed)
   MY CHART VIDEO VIRTUAL OBSTETRICS VISIT ENCOUNTER NOTE  I connected with Rachael Jones on 11/30/18 at  2:10 PM EDT by My Chart video at home and verified that I am speaking with the correct person using two identifiers.   I discussed the limitations, risks, security and privacy concerns of performing an evaluation and management service by My Chart video and the availability of in person appointments. I also discussed with the patient that there may be a patient responsible charge related to this service. The patient expressed understanding and agreed to proceed.  Subjective:  Rachael Jones is a 38 y.o. G6P5005 at [redacted]w[redacted]d being followed for ongoing prenatal care.  She is currently monitored for the following issues for this low-risk pregnancy and has Depression, major, single episode, moderate ([place]); Alcohol abuse; [redacted] weeks gestation of pregnancy; Supervision of other normal pregnancy, antepartum; AMA (advanced maternal age) multigravida 35+; and Iron deficiency anemia during pregnancy on their problem list.  Patient reports no complaints. Reports fetal movement. Denies any contractions, bleeding or leaking of fluid.   The following portions of the patient's history were reviewed and updated as appropriate: allergies, current medications, past family history, past medical history, past social history, past surgical history and problem list.   Objective:   General:  Alert, oriented and cooperative.   Mental Status: Normal mood and affect perceived. Normal judgment and thought content.  Rest of physical exam deferred due to type of encounter  BP 113/81   Pulse 94   LMP 04/28/2018 (Approximate)  **Done by patient's own at home BP cuff; no scale at home  Assessment and Plan:  Pregnancy: G6P5005 at [redacted]w[redacted]d  1. Antepartum multigravida of advanced maternal age - Will not be over 53yo at the time of delivery, therefore, antenatal guidelines for AMA do not apply  2. Supervision of other normal  pregnancy, antepartum - Discussed GBS and GC/CT collection at next visit  3. Iron deficiency anemia during pregnancy - Taking iron supplements  Preterm labor symptoms and general obstetric precautions including but not limited to vaginal bleeding, contractions, leaking of fluid and fetal movement were reviewed in detail with the patient.  I discussed the assessment and treatment plan with the patient. The patient was provided an opportunity to ask questions and all were answered. The patient agreed with the plan and demonstrated an understanding of the instructions. The patient was advised to call back or seek an in-person office evaluation/go to [facility] at [facility] for any urgent or concerning symptoms. Please refer to After Visit Summary for other counseling recommendations.   I provided 10 minutes of non-face-to-face time during this encounter. There was 5 minutes of chart review time spent prior to this encounter. Total time spent = 15 minutes.  Return in about 4 weeks (around 12/28/2018) for Return OB w/GBS.  Future Appointments  Date Time Provider [place]  12/28/2018  9:10 AM Laury Deep, CNM [facility] None  01/09/2019  2:00 PM Azzie Glatter, FNP [facility] None    Laury Deep, [place] for [employer], [place]

## 2018-12-28 ENCOUNTER — Other Ambulatory Visit (HOSPITAL_COMMUNITY)
Admission: RE | Admit: 2018-12-28 | Discharge: 2018-12-28 | Disposition: A | Discharge status: Home / Self Care | Payer: Medicaid Other | Source: Ambulatory Visit | Attending: Obstetrics and Gynecology | Admitting: Obstetrics and Gynecology

## 2018-12-28 ENCOUNTER — Ambulatory Visit (INDEPENDENT_AMBULATORY_CARE_PROVIDER_SITE_OTHER): Payer: Medicaid Other | Admitting: Obstetrics and Gynecology

## 2018-12-28 ENCOUNTER — Other Ambulatory Visit: Payer: Self-pay

## 2018-12-28 VITALS — BP 112/73 | HR 97 | Temp 98.0°F | Wt 192.8 lb

## 2018-12-28 DIAGNOSIS — Z3480 Encounter for supervision of other normal pregnancy, unspecified trimester: Secondary | ICD-10-CM | POA: Diagnosis present

## 2018-12-28 DIAGNOSIS — O99013 Anemia complicating pregnancy, third trimester: Secondary | ICD-10-CM

## 2018-12-28 DIAGNOSIS — D509 Iron deficiency anemia, unspecified: Secondary | ICD-10-CM

## 2018-12-28 DIAGNOSIS — Z3A36 36 weeks gestation of pregnancy: Secondary | ICD-10-CM

## 2018-12-28 DIAGNOSIS — O99019 Anemia complicating pregnancy, unspecified trimester: Secondary | ICD-10-CM

## 2018-12-28 DIAGNOSIS — O09523 Supervision of elderly multigravida, third trimester: Secondary | ICD-10-CM

## 2018-12-28 DIAGNOSIS — O09529 Supervision of elderly multigravida, unspecified trimester: Secondary | ICD-10-CM

## 2018-12-29 ENCOUNTER — Encounter: Payer: Self-pay | Admitting: Obstetrics and Gynecology

## 2018-12-29 NOTE — Progress Notes (Signed)
   LOW-RISK PREGNANCY OFFICE VISIT Patient name: Rachael Jones MRN [id number]  Date of birth: 11/16/1980 Chief Complaint:   Routine Prenatal Visit  History of Present Illness:   Rachael Jones is a 38 y.o. G48P5005 female at [redacted]w[redacted]d with an Estimated Date of Delivery: 01/22/19 being seen today for ongoing management of a low-risk pregnancy.  Today she reports no complaints. Contractions: Not present. Vag. Bleeding: None.  Movement: Present. denies leaking of fluid. Review of Systems:   Pertinent items are noted in HPI Denies abnormal vaginal discharge w/ itching/odor/irritation, headaches, visual changes, shortness of breath, chest pain, abdominal pain, severe nausea/vomiting, or problems with urination or bowel movements unless otherwise stated above. Pertinent History Reviewed:  Reviewed past medical,surgical, social, obstetrical and family history.  Reviewed problem list, medications and allergies. Physical Assessment:   Vitals:   12/28/18 0922 12/28/18 1006  BP: (!) 146/67 112/73  Pulse: (!) 112 97  Temp: 98 F (36.7 C)   Weight: 192 lb 12.8 oz (87.5 kg)   Body mass index is 30.2 kg/m.        Physical Examination:   General appearance: Well appearing, and in no distress  Mental status: Alert, oriented to person, place, and time  Skin: Warm & dry  Cardiovascular: Normal heart rate noted  Respiratory: Normal respiratory effort, no distress  Abdomen: Soft, gravid, nontender  Pelvic: Cervical exam performed  Dilation: Closed Effacement (%): Thick Station: Ballotable  Extremities: Edema: None  Fetal Status: Fetal Heart Rate (bpm): 142 Fundal Height: 39 cm Movement: Present Presentation: Vertex    Assessment & Plan:  1) Low-risk pregnancy G6P5005 at [redacted]w[redacted]d with an Estimated Date of Delivery: 01/22/19   2) Supervision of other normal pregnancy, antepartum  - Culture, beta strep (group b only),  - Cervicovaginal ancillary only( [place])  3) Antepartum multigravida of advanced  maternal age  6) Iron deficiency anemia during pregnancy - Continue with FeSO4   Meds: none  Labs/procedures today: none  Plan:  Continue routine obstetrical care   Reviewed: Preterm labor symptoms and general obstetric precautions including but not limited to vaginal bleeding, contractions, leaking of fluid and fetal movement were reviewed in detail with the patient.  All questions were answered. Has home bp cuff. Check bp weekly, let us know if >140/90.   Follow-up: Return in about 2 weeks (around 01/11/2019) for Return OB - My Chart video.  Orders Placed This Encounter  Procedures  . Culture, beta strep (group b only)   Laury Deep MSN, CNM 12/28/2018

## 2018-12-30 LAB — CERVICOVAGINAL ANCILLARY ONLY
Bacterial Vaginitis (gardnerella): NEGATIVE
Candida Glabrata: NEGATIVE
Candida Vaginitis: NEGATIVE
Chlamydia: NEGATIVE
Comment: NEGATIVE
Comment: NEGATIVE
Comment: NEGATIVE
Comment: NEGATIVE
Comment: NEGATIVE
Comment: NORMAL
Neisseria Gonorrhea: NEGATIVE
Trichomonas: NEGATIVE

## 2018-12-31 LAB — CULTURE, BETA STREP (GROUP B ONLY): Strep Gp B Culture: POSITIVE — AB

## 2019-01-09 ENCOUNTER — Ambulatory Visit: Payer: Medicaid Other | Admitting: Family Medicine

## 2019-01-12 ENCOUNTER — Other Ambulatory Visit: Payer: Self-pay

## 2019-01-12 ENCOUNTER — Ambulatory Visit (INDEPENDENT_AMBULATORY_CARE_PROVIDER_SITE_OTHER): Payer: Medicaid Other | Admitting: Obstetrics and Gynecology

## 2019-01-12 ENCOUNTER — Encounter: Payer: Self-pay | Admitting: Obstetrics and Gynecology

## 2019-01-12 VITALS — BP 130/67 | HR 109 | Temp 109.0°F | Wt 191.8 lb

## 2019-01-12 DIAGNOSIS — O09523 Supervision of elderly multigravida, third trimester: Secondary | ICD-10-CM

## 2019-01-12 DIAGNOSIS — O99019 Anemia complicating pregnancy, unspecified trimester: Secondary | ICD-10-CM

## 2019-01-12 DIAGNOSIS — Z3480 Encounter for supervision of other normal pregnancy, unspecified trimester: Secondary | ICD-10-CM

## 2019-01-12 DIAGNOSIS — D509 Iron deficiency anemia, unspecified: Secondary | ICD-10-CM

## 2019-01-12 DIAGNOSIS — O99013 Anemia complicating pregnancy, third trimester: Secondary | ICD-10-CM

## 2019-01-12 DIAGNOSIS — Z3A38 38 weeks gestation of pregnancy: Secondary | ICD-10-CM

## 2019-01-12 DIAGNOSIS — O09529 Supervision of elderly multigravida, unspecified trimester: Secondary | ICD-10-CM
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
## 2019-01-13 LAB — URINE DRUGS OF ABUSE SCREEN W ALC, ROUTINE (REF LAB)
Amphetamines, Urine: NEGATIVE ng/mL
Barbiturate Quant, Ur: NEGATIVE ng/mL
Benzodiazepine Quant, Ur: NEGATIVE ng/mL
Cannabinoid Quant, Ur: NEGATIVE ng/mL
Cocaine (Metab.): NEGATIVE ng/mL
Ethanol, Urine: NEGATIVE %
Methadone Screen, Urine: NEGATIVE ng/mL
Opiate Quant, Ur: NEGATIVE ng/mL
PCP Quant, Ur: NEGATIVE ng/mL
Propoxyphene: NEGATIVE ng/mL

## 2019-01-16 ENCOUNTER — Telehealth: Payer: Self-pay | Admitting: Obstetrics and Gynecology

## 2019-01-16 NOTE — Telephone Encounter (Signed)
Labor plans sent to me from patient:  Birth Plan: 1. A lot of Ice :) 2. Epidural 3. Cool Room 4. No assumptions that I've done it before, that it's gonna be easy. ( each time is different) 5. Dim Lit Room 6. Calm, patient staff 7. A lot of Ice :) 8. If I request no visitors. No visitors. Thank You    Laury Deep, CNM

## 2019-01-17 ENCOUNTER — Other Ambulatory Visit: Payer: Self-pay | Admitting: Obstetrics and Gynecology

## 2019-01-17 DIAGNOSIS — N898 Other specified noninflammatory disorders of vagina: Secondary | ICD-10-CM

## 2019-01-17 MED ORDER — TERCONAZOLE 0.4 % VA CREA: 1.0000 | TOPICAL_CREAM | Freq: Every day | VAGINAL | 0 refills | Status: DC

## 2019-01-17 NOTE — Progress Notes (Signed)
Patient notified via My Chart

## 2019-01-19 ENCOUNTER — Ambulatory Visit (INDEPENDENT_AMBULATORY_CARE_PROVIDER_SITE_OTHER): Payer: Medicaid Other | Admitting: Obstetrics and Gynecology

## 2019-01-19 ENCOUNTER — Encounter: Payer: Self-pay | Admitting: Obstetrics and Gynecology

## 2019-01-19 ENCOUNTER — Other Ambulatory Visit: Payer: Self-pay

## 2019-01-19 ENCOUNTER — Encounter: Payer: Self-pay | Admitting: General Practice

## 2019-01-19 VITALS — BP 112/58 | HR 101 | Temp 98.0°F | Wt 190.8 lb

## 2019-01-19 DIAGNOSIS — Z3480 Encounter for supervision of other normal pregnancy, unspecified trimester: Secondary | ICD-10-CM

## 2019-01-19 DIAGNOSIS — D509 Iron deficiency anemia, unspecified: Secondary | ICD-10-CM

## 2019-01-19 DIAGNOSIS — O99013 Anemia complicating pregnancy, third trimester: Secondary | ICD-10-CM

## 2019-01-19 DIAGNOSIS — B3731 Acute candidiasis of vulva and vagina: Secondary | ICD-10-CM

## 2019-01-19 DIAGNOSIS — Z3A39 39 weeks gestation of pregnancy: Secondary | ICD-10-CM

## 2019-01-19 DIAGNOSIS — O99019 Anemia complicating pregnancy, unspecified trimester: Secondary | ICD-10-CM

## 2019-01-19 DIAGNOSIS — B373 Candidiasis of vulva and vagina: Secondary | ICD-10-CM

## 2019-01-19 NOTE — Progress Notes (Signed)
   LOW-RISK PREGNANCY OFFICE VISIT Patient name: Rachael Jones MRN [id number]  Date of birth: December 24, 1980 Chief Complaint:   Routine Prenatal Visit  History of Present Illness:   Rachael Jones is a 39 y.o. G81P5005 female at [redacted]w[redacted]d with an Estimated Date of Delivery: 01/22/19 being seen today for ongoing management of a low-risk pregnancy.  Today she reports occasional contractions and vaginal irritation. Contractions: Irregular. Vag. Bleeding: None.  Movement: Present. denies leaking of fluid. Review of Systems:   Pertinent items are noted in HPI Denies abnormal vaginal discharge w/ itching/odor/irritation, headaches, visual changes, shortness of breath, chest pain, abdominal pain, severe nausea/vomiting, or problems with urination or bowel movements unless otherwise stated above. Pertinent History Reviewed:  Reviewed past medical,surgical, social, obstetrical and family history.  Reviewed problem list, medications and allergies. Physical Assessment:   Vitals:   01/19/19 1324  BP: (!) 112/58  Pulse: (!) 101  Temp: 98 F (36.7 C)  Weight: 190 lb 12.8 oz (86.5 kg)  Body mass index is 29.88 kg/m.        Physical Examination:   General appearance: Well appearing, and in no distress  Mental status: Alert, oriented to person, place, and time  Skin: Warm & dry  Cardiovascular: Normal heart rate noted  Respiratory: Normal respiratory effort, no distress  Abdomen: Soft, gravid, nontender  Pelvic: Cervical exam performed  Dilation: 2 Effacement (%): 50 Station: Ballotable  Extremities: Edema: None  Fetal Status: Fetal Heart Rate (bpm): 140 Fundal Height: 41 cm Movement: Present Presentation: Vertex  No results found for this or any previous visit (from the past 24 hour(s)).  Assessment & Plan:  1) Low-risk pregnancy G6P5005 at [redacted]w[redacted]d with an Estimated Date of Delivery: 01/22/19   2) Supervision of other normal pregnancy, antepartum - Schedule IOL for 40 wks -- patient requests  01/23/19  3) Iron deficiency anemia during pregnancy - Taking iron supplements  4) Vaginal yeast infection - Rx'd Terazol prefers not to try alternative therapies d/t being afarid to "stick anything up there"   Meds: No orders of the defined types were placed in this encounter.  Labs/procedures today: none  Plan:  Continue routine obstetrical care   Reviewed: Term labor symptoms and general obstetric precautions including but not limited to vaginal bleeding, contractions, leaking of fluid and fetal movement were reviewed in detail with the patient.  All questions were answered. Has home bp cuff. Check bp weekly, let us know if >140/90.   Follow-up: Return in about 6 weeks (around 03/02/2019) for Postpartum Visit and Paragard IUD insertion.  No orders of the defined types were placed in this encounter.  Laury Deep MSN, CNM 01/19/2019 1:50 PM

## 2019-01-20 ENCOUNTER — Telehealth (HOSPITAL_COMMUNITY): Payer: Self-pay | Admitting: *Deleted

## 2019-01-20 ENCOUNTER — Encounter (HOSPITAL_COMMUNITY): Payer: Self-pay | Admitting: *Deleted

## 2019-01-20 NOTE — Telephone Encounter (Signed)
Preadmission screen  

## 2019-01-21 ENCOUNTER — Other Ambulatory Visit (HOSPITAL_COMMUNITY)
Admission: RE | Admit: 2019-01-21 | Discharge: 2019-01-21 | Disposition: A | Discharge status: Home / Self Care | Payer: Medicaid Other | Source: Ambulatory Visit | Attending: Obstetrics and Gynecology | Admitting: Obstetrics and Gynecology

## 2019-01-21 DIAGNOSIS — Z20828 Contact with and (suspected) exposure to other viral communicable diseases: Secondary | ICD-10-CM | POA: Insufficient documentation

## 2019-01-21 DIAGNOSIS — Z01812 Encounter for preprocedural laboratory examination: Secondary | ICD-10-CM | POA: Insufficient documentation

## 2019-01-21 LAB — SARS CORONAVIRUS 2 (TAT 6-24 HRS): SARS Coronavirus 2: NEGATIVE

## 2019-01-23 ENCOUNTER — Inpatient Hospital Stay (HOSPITAL_COMMUNITY): Payer: Medicaid Other

## 2019-01-24 ENCOUNTER — Inpatient Hospital Stay (HOSPITAL_COMMUNITY): Payer: Medicaid Other | Admitting: Anesthesiology

## 2019-01-24 ENCOUNTER — Inpatient Hospital Stay (HOSPITAL_COMMUNITY)
Admission: AD | Admit: 2019-01-24 | Discharge: 2019-01-26 | DRG: 807 | Disposition: A | Discharge status: Home / Self Care | Payer: Medicaid Other | Attending: Family Medicine | Admitting: Family Medicine

## 2019-01-24 ENCOUNTER — Inpatient Hospital Stay (HOSPITAL_COMMUNITY): Payer: Medicaid Other

## 2019-01-24 ENCOUNTER — Other Ambulatory Visit: Payer: Self-pay

## 2019-01-24 ENCOUNTER — Encounter (HOSPITAL_COMMUNITY): Payer: Self-pay | Admitting: Obstetrics and Gynecology

## 2019-01-24 DIAGNOSIS — O9902 Anemia complicating childbirth: Secondary | ICD-10-CM | POA: Diagnosis present

## 2019-01-24 DIAGNOSIS — Z3A40 40 weeks gestation of pregnancy: Secondary | ICD-10-CM

## 2019-01-24 DIAGNOSIS — Z20828 Contact with and (suspected) exposure to other viral communicable diseases: Secondary | ICD-10-CM | POA: Diagnosis present

## 2019-01-24 DIAGNOSIS — D649 Anemia, unspecified: Secondary | ICD-10-CM | POA: Diagnosis present

## 2019-01-24 DIAGNOSIS — O09529 Supervision of elderly multigravida, unspecified trimester: Secondary | ICD-10-CM

## 2019-01-24 DIAGNOSIS — Z3490 Encounter for supervision of normal pregnancy, unspecified, unspecified trimester: Secondary | ICD-10-CM | POA: Diagnosis present

## 2019-01-24 DIAGNOSIS — Z87891 Personal history of nicotine dependence: Secondary | ICD-10-CM | POA: Diagnosis not present

## 2019-01-24 DIAGNOSIS — O99824 Streptococcus B carrier state complicating childbirth: Secondary | ICD-10-CM | POA: Diagnosis present

## 2019-01-24 DIAGNOSIS — D509 Iron deficiency anemia, unspecified: Secondary | ICD-10-CM

## 2019-01-24 DIAGNOSIS — O480 Post-term pregnancy: Secondary | ICD-10-CM | POA: Diagnosis present

## 2019-01-24 DIAGNOSIS — Z3480 Encounter for supervision of other normal pregnancy, unspecified trimester: Secondary | ICD-10-CM

## 2019-01-24 LAB — ABO/RH: ABO/RH(D): A POS

## 2019-01-24 LAB — CBC
HCT: 27 % — ABNORMAL LOW (ref 36.0–46.0)
Hemoglobin: 8.2 g/dL — ABNORMAL LOW (ref 12.0–15.0)
MCH: 24.9 pg — ABNORMAL LOW (ref 26.0–34.0)
MCHC: 30.4 g/dL (ref 30.0–36.0)
MCV: 82.1 fL (ref 80.0–100.0)
Platelets: 280 10*3/uL (ref 150–400)
RBC: 3.29 MIL/uL — ABNORMAL LOW (ref 3.87–5.11)
RDW: 15.9 % — ABNORMAL HIGH (ref 11.5–15.5)
WBC: 8.5 10*3/uL (ref 4.0–10.5)
nRBC: 0 % (ref 0.0–0.2)

## 2019-01-24 LAB — RPR: RPR Ser Ql: NONREACTIVE

## 2019-01-24 LAB — TYPE AND SCREEN
ABO/RH(D): A POS
Antibody Screen: NEGATIVE

## 2019-01-24 MED ORDER — LIDOCAINE HCL (PF) 1 % IJ SOLN
INTRAMUSCULAR | Status: DC | PRN
  Administered 2019-01-24: 5 mL via EPIDURAL
  Administered 2019-01-24: 4 mL via EPIDURAL

## 2019-01-24 MED ORDER — OXYCODONE-ACETAMINOPHEN 5-325 MG PO TABS: 1.0000 | ORAL_TABLET | ORAL | Status: DC | PRN

## 2019-01-24 MED ORDER — BENZOCAINE-MENTHOL 20-0.5 % EX AERO: 1.0000 "application " | INHALATION_SPRAY | CUTANEOUS | Status: DC | PRN

## 2019-01-24 MED ORDER — PENICILLIN G POT IN DEXTROSE 60000 UNIT/ML IV SOLN
3.0000 10*6.[IU] | INTRAVENOUS | Status: DC
  Administered 2019-01-24 (×2): 3 10*6.[IU] via INTRAVENOUS
  Filled 2019-01-24 (×3): qty 50

## 2019-01-24 MED ORDER — ONDANSETRON HCL 4 MG PO TABS: 4.0000 mg | ORAL_TABLET | ORAL | Status: DC | PRN

## 2019-01-24 MED ORDER — LACTATED RINGERS IV SOLN: INTRAVENOUS | Status: DC

## 2019-01-24 MED ORDER — PRENATAL MULTIVITAMIN CH
1.0000 | ORAL_TABLET | Freq: Every day | ORAL | Status: DC
  Administered 2019-01-25: 1 via ORAL
  Filled 2019-01-24 (×2): qty 1

## 2019-01-24 MED ORDER — SENNOSIDES-DOCUSATE SODIUM 8.6-50 MG PO TABS
2.0000 | ORAL_TABLET | ORAL | Status: DC
  Administered 2019-01-25: 2 via ORAL
  Filled 2019-01-24 (×2): qty 2

## 2019-01-24 MED ORDER — ONDANSETRON HCL 4 MG/2ML IJ SOLN: 4.0000 mg | INTRAMUSCULAR | Status: DC | PRN

## 2019-01-24 MED ORDER — OXYTOCIN 40 UNITS IN NORMAL SALINE INFUSION - SIMPLE MED
1.0000 m[IU]/min | INTRAVENOUS | Status: DC
  Administered 2019-01-24: 2 m[IU]/min via INTRAVENOUS

## 2019-01-24 MED ORDER — OXYCODONE-ACETAMINOPHEN 5-325 MG PO TABS: 2.0000 | ORAL_TABLET | ORAL | Status: DC | PRN

## 2019-01-24 MED ORDER — COCONUT OIL OIL: 1.0000 "application " | TOPICAL_OIL | Status: DC | PRN

## 2019-01-24 MED ORDER — SIMETHICONE 80 MG PO CHEW: 80.0000 mg | CHEWABLE_TABLET | ORAL | Status: DC | PRN

## 2019-01-24 MED ORDER — PHENYLEPHRINE 40 MCG/ML (10ML) SYRINGE FOR IV PUSH (FOR BLOOD PRESSURE SUPPORT): 80.0000 ug | PREFILLED_SYRINGE | INTRAVENOUS | Status: DC | PRN

## 2019-01-24 MED ORDER — DIPHENHYDRAMINE HCL 50 MG/ML IJ SOLN: 12.5000 mg | INTRAMUSCULAR | Status: DC | PRN

## 2019-01-24 MED ORDER — LIDOCAINE HCL (PF) 1 % IJ SOLN: 30.0000 mL | INTRAMUSCULAR | Status: DC | PRN

## 2019-01-24 MED ORDER — OXYTOCIN BOLUS FROM INFUSION
500.0000 mL | Freq: Once | INTRAVENOUS | Status: AC
  Administered 2019-01-24: 500 mL via INTRAVENOUS

## 2019-01-24 MED ORDER — SOD CITRATE-CITRIC ACID 500-334 MG/5ML PO SOLN
30.0000 mL | ORAL | Status: DC | PRN
  Administered 2019-01-24 (×2): 30 mL via ORAL
  Filled 2019-01-24 (×2): qty 30

## 2019-01-24 MED ORDER — LACTATED RINGERS IV SOLN: 500.0000 mL | Freq: Once | INTRAVENOUS | Status: DC

## 2019-01-24 MED ORDER — WITCH HAZEL-GLYCERIN EX PADS: 1.0000 "application " | MEDICATED_PAD | CUTANEOUS | Status: DC | PRN

## 2019-01-24 MED ORDER — IBUPROFEN 600 MG PO TABS
600.0000 mg | ORAL_TABLET | Freq: Four times a day (QID) | ORAL | Status: DC
  Administered 2019-01-25 (×3): 600 mg via ORAL
  Filled 2019-01-24 (×7): qty 1

## 2019-01-24 MED ORDER — FERROUS SULFATE 325 (65 FE) MG PO TABS
325.0000 mg | ORAL_TABLET | Freq: Two times a day (BID) | ORAL | Status: DC
  Administered 2019-01-25 – 2019-01-26 (×2): 325 mg via ORAL
  Filled 2019-01-24 (×3): qty 1

## 2019-01-24 MED ORDER — ONDANSETRON HCL 4 MG/2ML IJ SOLN: 4.0000 mg | Freq: Four times a day (QID) | INTRAMUSCULAR | Status: DC | PRN

## 2019-01-24 MED ORDER — FENTANYL-BUPIVACAINE-NACL 0.5-0.125-0.9 MG/250ML-% EP SOLN
12.0000 mL/h | EPIDURAL | Status: DC | PRN
  Filled 2019-01-24: qty 250

## 2019-01-24 MED ORDER — TERBUTALINE SULFATE 1 MG/ML IJ SOLN: 0.2500 mg | Freq: Once | INTRAMUSCULAR | Status: DC | PRN

## 2019-01-24 MED ORDER — ZOLPIDEM TARTRATE 5 MG PO TABS: 5.0000 mg | ORAL_TABLET | Freq: Every evening | ORAL | Status: DC | PRN

## 2019-01-24 MED ORDER — EPHEDRINE 5 MG/ML INJ: 10.0000 mg | INTRAVENOUS | Status: DC | PRN

## 2019-01-24 MED ORDER — DIBUCAINE (PERIANAL) 1 % EX OINT: 1.0000 "application " | TOPICAL_OINTMENT | CUTANEOUS | Status: DC | PRN

## 2019-01-24 MED ORDER — SODIUM CHLORIDE (PF) 0.9 % IJ SOLN
INTRAMUSCULAR | Status: DC | PRN
  Administered 2019-01-24: 12 mL/h via EPIDURAL

## 2019-01-24 MED ORDER — TETANUS-DIPHTH-ACELL PERTUSSIS 5-2.5-18.5 LF-MCG/0.5 IM SUSP: 0.5000 mL | Freq: Once | INTRAMUSCULAR | Status: DC

## 2019-01-24 MED ORDER — SODIUM CHLORIDE 0.9 % IV SOLN
5.0000 10*6.[IU] | Freq: Once | INTRAVENOUS | Status: AC
  Administered 2019-01-24: 5 10*6.[IU] via INTRAVENOUS
  Filled 2019-01-24: qty 5

## 2019-01-24 MED ORDER — LACTATED RINGERS IV SOLN: 500.0000 mL | INTRAVENOUS | Status: DC | PRN

## 2019-01-24 MED ORDER — DIPHENHYDRAMINE HCL 25 MG PO CAPS: 25.0000 mg | ORAL_CAPSULE | Freq: Four times a day (QID) | ORAL | Status: DC | PRN

## 2019-01-24 MED ORDER — ACETAMINOPHEN 325 MG PO TABS
650.0000 mg | ORAL_TABLET | ORAL | Status: DC | PRN
  Administered 2019-01-25: 650 mg via ORAL
  Filled 2019-01-24: qty 2

## 2019-01-24 MED ORDER — OXYTOCIN 40 UNITS IN NORMAL SALINE INFUSION - SIMPLE MED
2.5000 [IU]/h | INTRAVENOUS | Status: DC
  Administered 2019-01-24: 2.5 [IU]/h via INTRAVENOUS
  Filled 2019-01-24: qty 1000

## 2019-01-24 MED ORDER — ACETAMINOPHEN 325 MG PO TABS: 650.0000 mg | ORAL_TABLET | ORAL | Status: DC | PRN
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
## 2019-01-24 NOTE — H&P (Signed)
OBSTETRIC ADMISSION HISTORY AND PHYSICAL  Neera Teng is a 38 y.o. female [phone] with IUP at [redacted]w[redacted]d by 13.3 week ultrasound presenting for an elective induction of labor.   Reports fetal movement. Denies vaginal bleeding.  She received her prenatal care at [facility]  Support person in labor: Lennis (FOB)  Ultrasounds . Anatomy U/S: normal  Prenatal History/Complications: . Anemia of Pregnancy  OB BOX:  Nursing Staff Provider  Office Location  [facility] Dating  U/S @13 .3w  Language  English Anatomy  normal  Flu Vaccine  Decline Genetic Screen  NIPS: Normal  AFP:   First Screen:  Quad:    TDaP vaccine   Decline  Hgb A1C or  GTT  Third trimester  Glucose, Fasting 65 - 91 mg/dL 70   Glucose, 1 hour 65 - 179 mg/dL 94   Glucose, 2 hour 65 - 152 mg/dL 67      Rhogam     LAB RESULTS   Feeding Plan Breast Blood Type  A Positive  Contraception Nexplanon Antibody  Negative  Circumcision n/a Rubella  IMMUNE  Pediatrician  Undecided RPR  NR  Support Person FOB-Lennis HBsAg  Negative  Prenatal Classes No HIV  NR  BTL Consent N/A GBS  (For PCN allergy, check sensitivities)   VBAC Consent N/A Pap Negative (11/05/2016)    Hgb Electro  Negative  BP Cuff Ordered@NOB  CF Negative    SMA Negative    Waterbirth  [ ]  Class [ ]  Consent [ ]  CNM visit   Past Medical History: Past Medical History:  Diagnosis Date  . Complication of anesthesia    Epidurals did not work w/labor  . Medical history non-contributory     Past Surgical History: Past Surgical History:  Procedure Laterality Date  . NO PAST SURGERIES    . VAGINAL DELIVERY     X 5  . WISDOM TOOTH EXTRACTION      Obstetrical History: OB History    Gravida  6   Para  5   Term  5   Preterm      AB      Living  5     SAB      TAB      Ectopic      Multiple      Live Births  5           Social History: Social History   Socioeconomic History  . Marital status: Single    Spouse name: Not on  file  . Number of children: 5  . Years of education: 01/05/2017  . Highest education level: 12th grade  Occupational History  . Occupation: Unemployed  Tobacco Use  . Smoking status: Former Smoker    Quit date: 12/22/1999    Years since quitting: 19.1  . Smokeless tobacco: Never Used  Substance and Sexual Activity  . Alcohol use: Not Currently    Comment: occas. glass of wine  . Drug use: No  . Sexual activity: Yes    Birth control/protection: I.U.D.    Comment: IUD removed 2 years ago  Other Topics Concern  . Not on file  Social History Narrative  . Not on file   Social Determinants of Health   Financial Resource Strain: Unknown  . Difficulty of Paying Living Expenses: Patient refused  Food Insecurity: No Food Insecurity  . Worried About in the Last Year: Never true  . Ran Out of Food in the Last Year: Never true  Transportation Needs: Unknown  . Lack of Transportation (Medical): Patient refused  . Lack of Transportation (Non-Medical): Patient refused  Physical Activity: Unknown  . Days of Exercise per Week: Patient refused  . Minutes of Exercise per Session: Patient refused  Stress:   . Feeling of Stress : Not on file  Social Connections: Unknown  . Frequency of Communication with Friends and Family: Patient refused  . Frequency of Social Gatherings with Friends and Family: Patient refused  . Attends Religious Services: Patient refused  . Active Member of Clubs or Organizations: Patient refused  . Attends BankerClub or Organization Meetings: Patient refused  . Marital Status: Patient refused    Family History: Family History  Problem Relation Age of Onset  . Other Mother        healthy   . Heart attack Father     Allergies: No Known Allergies  Medications Prior to Admission  Medication Sig Dispense Refill Last Dose  . Blood Pressure Monitoring (BLOOD PRESSURE MONITOR AUTOMAT) DEVI Appropriate size cuff (forearm circumference) with automatic  BP monitor. To be monitored regularly at home. ICD-10 code: Z34.80, Supervision of other normal pregnancy. 1 Device 0 01/23/2019 at Unknown time  . DOCOSAHEXAENOIC ACID PO Take 1 tablet by mouth.     . etonogestrel-ethinyl estradiol (NUVARING) 0.12-0.015 MG/24HR vaginal ring Place 1 each vaginally every 21 ( twenty-one) days. Insert one (1) ring vaginally and leave in place for three (3) weeks, then remove for one (1) week. 1 each 11   . Fe Fum-FePoly-FA-Vit C-Vit B3 (INTEGRA F) 125-1 MG CAPS Take 1 capsule by mouth daily. 30 capsule 2   . terconazole (TERAZOL 7) 0.4 % vaginal cream Place 1 applicator vaginally at bedtime for 7 days. 45 g 0      Review of Systems  All systems reviewed and negative except as stated in HPI  Blood pressure 121/67, pulse 90, temperature 97.8 F (36.6 C), temperature source Oral, resp. rate 18, height 5\' 7"  (1.702 m), weight 86.6 kg, last menstrual period 04/28/2018. General appearance: alert, cooperative and no distress Lungs: no respiratory distress Heart: regular rate  Abdomen: soft, non-tender; gravid  Pelvic: normal gynecoid Extremities: Homans sign is negative, no sign of DVT Presentation: cephalic by Leopold's maneuvers Fetal monitoring: 140 bpm / moderate variability / accels present / decels absent   Uterine activity: 2 UCs noted with UI   Dilation: 3 Effacement (%): 80 Station: -2 Exam by:: Jhair Witherington, CNM   Prenatal labs: ABO, Rh: A/Positive/-- (06/11 1403) Antibody: Negative (06/11 1403) Rubella: 1.60 (06/11 1403) RPR: Non Reactive (09/16 0929)  HBsAg: Negative (06/11 1403)  HIV: Non Reactive (09/16 0929)  GBS: Positive/-- (11/25 1001)  Glucola: normal  70-94-67 Genetic screening:  Normal Panorama and Horizon testing  Prenatal Transfer Tool  Maternal Diabetes: No Genetic Screening: Normal Maternal Ultrasounds/Referrals: Normal Fetal Ultrasounds or other Referrals:  None Maternal Substance Abuse:  No Significant Maternal Medications:   Meds include: Other: iron supplements Significant Maternal Lab Results: Group B Strep positive  Results for orders placed or performed during the hospital encounter of 01/24/19 (from the past 24 hour(s))  CBC   Collection Time: 01/24/19  9:00 AM  Result Value Ref Range   WBC 8.5 4.0 - 10.5 K/uL   RBC 3.29 (L) 3.87 - 5.11 MIL/uL   Hemoglobin 8.2 (L) 12.0 - 15.0 g/dL   HCT 64.427.0 (L) 03.436.0 - 74.246.0 %   MCV 82.1 80.0 - 100.0 fL   MCH 24.9 (L) 26.0 - 34.0 pg  MCHC 30.4 30.0 - 36.0 g/dL   RDW 15.9 (H) 11.5 - 15.5 %   Platelets 280 150 - 400 K/uL   nRBC 0.0 0.0 - 0.2 %    Patient Active Problem List   Diagnosis Date Noted  . Encounter for induction of labor 01/24/2019  . Iron deficiency anemia during pregnancy 10/20/2018  . AMA (advanced maternal age) multigravida 35+ 10/19/2018  . Supervision of other normal pregnancy, antepartum 07/11/2018  . [redacted] weeks gestation of pregnancy 06/29/2018  . Depression, major, single episode, moderate ([place]) 03/18/2014    Assessment/Plan:  Lorenzo Pereyra is a 38 y.o. G6P5005 at [redacted]w[redacted]d here for elective IOL   Labor: induction -- pain control: planning an epidural  Fetal Wellbeing: EFW 7.5 lbs by Leopold's. Cephalic by Leopold's.  -- GBS (+) -- continuous fetal monitoring   Postpartum Planning -- breast/Paragard IUD -- Declined dap    Laury Deep, CNM  01/24/2019, 9:34 AM

## 2019-01-24 NOTE — Anesthesia Procedure Notes (Signed)
Epidural Patient location during procedure: OB Start time: 01/24/2019 12:55 PM End time: 01/24/2019 12:59 PM  Staffing Anesthesiologist: Audry Pili, MD Performed: anesthesiologist   Preanesthetic Checklist Completed: patient identified, IV checked, risks and benefits discussed, monitors and equipment checked, pre-op evaluation and timeout performed  Epidural Patient position: sitting Prep: DuraPrep Patient monitoring: continuous pulse ox and blood pressure Approach: midline Location: L3-L4 Injection technique: LOR saline  Needle:  Needle type: Tuohy  Needle gauge: 17 G Needle length: 9 cm Needle insertion depth: 5 cm Catheter size: 19 Gauge Catheter at skin depth: 10 cm Test dose: negative and Other (1% lidocaine)  Assessment Events: blood not aspirated  Additional Notes Patient identified. Risks including, but not limited to, bleeding, infection, nerve damage, paralysis, inadequate analgesia, blood pressure changes, nausea, vomiting, allergic reaction, postpartum back pain, itching, and headache were discussed. Patient expressed understanding and wished to proceed. Sterile prep and drape, including hand hygiene, mask, and sterile gloves were used. The patient was positioned and the spine was prepped. The skin was anesthetized with lidocaine. No paraesthesia or other complication noted. The patient did not experience any signs of intravascular injection such as tinnitus or metallic taste in mouth, nor signs of intrathecal spread such as rapid motor block. Please see nursing notes for vital signs. The patient tolerated the procedure well.   Renold Don, MDReason for block:procedure for pain

## 2019-01-24 NOTE — Anesthesia Preprocedure Evaluation (Signed)
Anesthesia Evaluation  Patient identified by MRN, date of birth, ID band Patient awake    Reviewed: Allergy & Precautions, NPO status , Patient's Chart, lab work & pertinent test results  Airway Mallampati: II  TM Distance: >3 FB Neck ROM: Full    Dental   Pulmonary former smoker,    Pulmonary exam normal        Cardiovascular negative cardio ROS Normal cardiovascular exam     Neuro/Psych PSYCHIATRIC DISORDERS Depression negative neurological ROS     GI/Hepatic negative GI ROS, Neg liver ROS,   Endo/Other  negative endocrine ROS  Renal/GU negative Renal ROS     Musculoskeletal negative musculoskeletal ROS (+)   Abdominal   Peds  Hematology  (+) anemia ,  Plt 280k    Anesthesia Other Findings Previous hx of failed epidurals Covid negative 12/19  Reproductive/Obstetrics (+) Pregnancy                             Anesthesia Physical Anesthesia Plan  ASA: II  Anesthesia Plan: Epidural   Post-op Pain Management:    Induction:   PONV Risk Score and Plan: 2 and Treatment may vary due to age or medical condition  Airway Management Planned: Natural Airway  Additional Equipment: None  Intra-op Plan:   Post-operative Plan:   Informed Consent: I have reviewed the patients History and Physical, chart, labs and discussed the procedure including the risks, benefits and alternatives for the proposed anesthesia with the patient or authorized representative who has indicated his/her understanding and acceptance.       Plan Discussed with: Anesthesiologist  Anesthesia Plan Comments: (Labs reviewed. Platelets acceptable, patient not taking any blood thinning medications. Per RN, FHR tracing reported to be stable enough for sitting procedure. Risks and benefits discussed with patient, including PDPH, backache, epidural hematoma, failed epidural, blood pressure changes, allergic reaction, and  nerve injury. Patient expressed understanding and wished to proceed.)        Anesthesia Quick Evaluation

## 2019-01-24 NOTE — Progress Notes (Signed)
Initial visit with [facility] and her mother as she awaits the delivery of her 6th baby, a little girl.  Introduced spiritual care services and offered support.  Toriana reports she is in a good mood and things are going well.  I provided education about Advance Directives-living will and health care power of attorney.  Madason began exploring what her wishes might be should those documents need to be consulted.  At this time, she does not wish to complete the documentation given that she is emotionally and mentally focused on her labor, but would appreciate another opportunity to complete documentation during her stay.    Please notify spiritual care when patient is ready to complete documentation.  Please page as further needs arise.  Donald Prose. Elyn Peers, M.Div. [facility] Chaplain Pager [phone] Office [phone]

## 2019-01-24 NOTE — Progress Notes (Signed)
Rachael Jones is a 38 y.o. [id number] at [redacted]w[redacted]d by LMP admitted for induction of labor due to Post dates. Due date 01/22/2019.  Subjective: The patient's mother is now here for labor support. Patient has received first 2 doses of PCN for (+) GBS. Requests  Objective: BP 113/83   Pulse (!) 121   Temp 98 F (36.7 C) (Oral)   Resp 18   Ht 5\' 7"  (1.702 m)   Wt 86.6 kg   LMP 04/28/2018 (Approximate)   SpO2 99%   BMI 29.91 kg/m  No intake/output data recorded. Total I/O In: 1064.7 [I.V.:1064.7] Out: -   FHT:  FHR: 125 bpm, variability: moderate,  accelerations:  Present,  decelerations:  Absent UC:   regular, every 2-3 minutes SVE:   Dilation: 3 Effacement (%): 80 Station: -1 Exam by: Sunday Corn, CNM   Labs: Lab Results  Component Value Date   WBC 8.5 01/24/2019   HGB 8.2 (L) 01/24/2019   HCT 27.0 (L) 01/24/2019   MCV 82.1 01/24/2019   PLT 280 01/24/2019    Assessment / Plan: Induction of labor due to postterm,  progressing well on pitocin  Labor: Progressing on Pitocin,  Preeclampsia:  n/a Fetal Wellbeing:  Category I Pain Control:  Epidural I/D:  n/a Anticipated MOD:  NSVD  Laury Deep, CNM 01/24/2019, 3:00 PM

## 2019-01-25 NOTE — Anesthesia Postprocedure Evaluation (Signed)
Anesthesia Post Note  Patient: Rachael Jones  Procedure(s) Performed: AN AD [place]     Patient location during evaluation: Mother Baby Anesthesia Type: Epidural Level of consciousness: awake and alert Pain management: pain level controlled Vital Signs Assessment: post-procedure vital signs reviewed and stable Respiratory status: spontaneous breathing Cardiovascular status: stable Postop Assessment: no headache, adequate PO intake, no backache, patient able to bend at knees, able to ambulate and no apparent nausea or vomiting Anesthetic complications: no    Last Vitals:  Vitals:   01/25/19 0200 01/25/19 0548  BP: 108/64 (!) 106/58  Pulse: 82 80  Resp: 18 18  Temp: 36.7 C 36.8 C  SpO2:      Last Pain:  Vitals:   01/25/19 0548  TempSrc: Oral  PainSc:    Pain Goal:                   Ailene Ards

## 2019-01-25 NOTE — Progress Notes (Signed)
Post Partum Day 1 Subjective: no complaints, up ad lib, voiding and tolerating PO, small lochia, plans to breastfeed, IUD  Objective: Blood pressure (!) 106/58, pulse 80, temperature 98.2 F (36.8 C), temperature source Oral, resp. rate 18, height 5\' 7"  (1.702 m), weight 86.6 kg, last menstrual period 04/28/2018, SpO2 99 %, unknown if currently breastfeeding.  Physical Exam:  General: alert, cooperative and no distress Lochia:normal flow Chest: CTAB Heart: RRR no m/r/g Abdomen: +BS, soft, nontender,  Uterine Fundus: firm DVT Evaluation: No evidence of DVT seen on physical exam. Extremities: no edema  Recent Labs    01/24/19 0900  HGB 8.2*  HCT 27.0*    Assessment/Plan: Plan for discharge tomorrow   LOS: 1 day   Christin Fudge 01/25/2019, 7:07 AM

## 2019-01-25 NOTE — Progress Notes (Signed)
CSW acknowledged consult and attempted to meet with MOB. However, CSW acknowledges do not disturb sign on door. CSW will meet with MOB at a later time.  Norelle Runnion, LCSW [facility] [phone]     

## 2019-01-25 NOTE — Lactation Note (Signed)
This note was copied from a baby's chart. Lactation Consultation Note  Patient Name: Rachael Jones [id number] Date: 01/25/2019 Reason for consult: Initial assessment;Term P6.  Mom states she breastfed her previous babies for 6 months.  Her last child is now 38 years old.  Mom was sleeping with baby on her chest when I came in room.  I woke her up and offered to put baby in crib and reviewed safe sleep.  She said baby will cry if placed in crib.  When I asked her how breastfeeding was going she said it hurt.  Offered assist with correct positioning and latch.  Assisted with positioning baby in football hold.  Nipples erect and intact.  Breast tissue easily compressible.  Instructed on hand expression and large drops of colostrum expressed.  Baby opened and latched easily.  Active feeding observed.  Instructed on waking techniques and breast massage.  Instructed to feed with cues and call for assist prn.  Maternal Data Has patient been taught Hand Expression?: Yes Does the patient have breastfeeding experience prior to this delivery?: Yes  Feeding Feeding Type: Breast Fed  LATCH Score Latch: Grasps breast easily, tongue down, lips flanged, rhythmical sucking.  Audible Swallowing: A few with stimulation  Type of Nipple: Everted at rest and after stimulation  Comfort (Breast/Nipple): Soft / non-tender  Hold (Positioning): Assistance needed to correctly position infant at breast and maintain latch.  LATCH Score: 8  Interventions Interventions: Breast feeding basics reviewed;Adjust position;Assisted with latch;Support pillows;Skin to skin;Breast massage;Hand express  Lactation Tools Discussed/Used     Consult Status Consult Status: Follow-up Date: 01/26/19 Follow-up type: In-patient    Ave Filter 01/25/2019, 2:26 PM

## 2019-01-26 ENCOUNTER — Ambulatory Visit: Payer: Self-pay

## 2019-01-26 MED ORDER — IBUPROFEN 600 MG PO TABS: 600.0000 mg | ORAL_TABLET | Freq: Four times a day (QID) | ORAL | 0 refills | Status: DC

## 2019-01-26 MED ORDER — FERROUS SULFATE 325 (65 FE) MG PO TABS: 325.0000 mg | ORAL_TABLET | Freq: Two times a day (BID) | ORAL | 3 refills | Status: AC

## 2019-01-26 NOTE — Lactation Note (Signed)
This note was copied from a baby's chart. Lactation Consultation Note  Patient Name: Girl Berlene Dixson [id number] Date: 01/26/2019  Mom is an experienced breastfeeding mom.  Being d/c today.  Mom reprots her nipples are a little sore.  Especially the right.  Infant cuing on arrival.  Assisted mom in breastfeeding on the right.  Mom reports much more comfortable.  Showed mom how to get her in close on that side and laid her back slightly.  Mom reports she has never laid back to breastfeed, did noto know you could and she is very comfortable.  Put pillows under arms and head.  Urged hand expression and rub expressed breastmilk on nipples air dry/demo that on other nipple with mom for soreness. Discussed hand expression and spoon feeding some past breastfeeds also.  Mom reports this is her 6th child and she has never done that.  Someone came into take infants blood and exam infant.  Let mom know I would be back later to show her this.  Left mom and baby breastfeedcing   Maternal Data    Feeding Feeding Type: Breast Milk  LATCH Score                   Interventions    Lactation Tools Discussed/Used     Consult Status      Ben Sanz Thompson Caul 01/26/2019, 2:37 PM
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
## 2019-01-26 NOTE — Lactation Note (Signed)
This note was copied from a baby's chart. Lactation Consultation Note  Patient Name: Rachael Jones PNPYY'F Date: 01/26/2019  went back to see mom and they are being d/c.  Mom reports she has Lactation info for d/c.  Urged her to call as needed.   Maternal Data    Feeding Feeding Type: Breast Milk  LATCH Score                   Interventions    Lactation Tools Discussed/Used     Consult Status      Rachael Jones 01/26/2019, 2:46 PM

## 2019-02-08 ENCOUNTER — Telehealth: Payer: Self-pay | Admitting: General Practice

## 2019-02-08 NOTE — Telephone Encounter (Signed)
Left message on VM for patient to give our office a call to schedule with our [facility].

## 2019-02-15 ENCOUNTER — Other Ambulatory Visit: Payer: Self-pay

## 2019-02-15 ENCOUNTER — Telehealth (INDEPENDENT_AMBULATORY_CARE_PROVIDER_SITE_OTHER): Payer: Medicaid Other | Admitting: Licensed Clinical Social Worker

## 2019-02-15 DIAGNOSIS — F4321 Adjustment disorder with depressed mood: Secondary | ICD-10-CM
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
## 2019-02-21 ENCOUNTER — Inpatient Hospital Stay: Payer: Medicaid Other | Admitting: Family Medicine

## 2019-02-21 ENCOUNTER — Other Ambulatory Visit: Payer: Self-pay

## 2019-03-06 ENCOUNTER — Inpatient Hospital Stay: Payer: Medicaid Other | Admitting: Family Medicine

## 2019-03-09 ENCOUNTER — Ambulatory Visit: Payer: Medicaid Other | Admitting: Licensed Clinical Social Worker

## 2019-03-09 ENCOUNTER — Ambulatory Visit: Payer: Medicaid Other | Admitting: Obstetrics and Gynecology

## 2019-03-20 ENCOUNTER — Inpatient Hospital Stay: Payer: Medicaid Other | Admitting: Family Medicine
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
## 2020-05-22 IMAGING — US OBSTETRIC <14 WK ULTRASOUND
1 series · 15 of 28 positions shown · non-contrast
Comparison: None

CLINICAL DATA: Dating, confirm viability, supervision of normal
first trimester pregnancy

EXAM:
OBSTETRIC <14 WK ULTRASOUND
TECHNIQUE: Transabdominal ultrasound was performed for evaluation of the
gestation as well as the maternal uterus and adnexal regions.

[Series 1: obstetric <14 wk ultrasound · 15 of 32 slices shown]
[im 1/32]
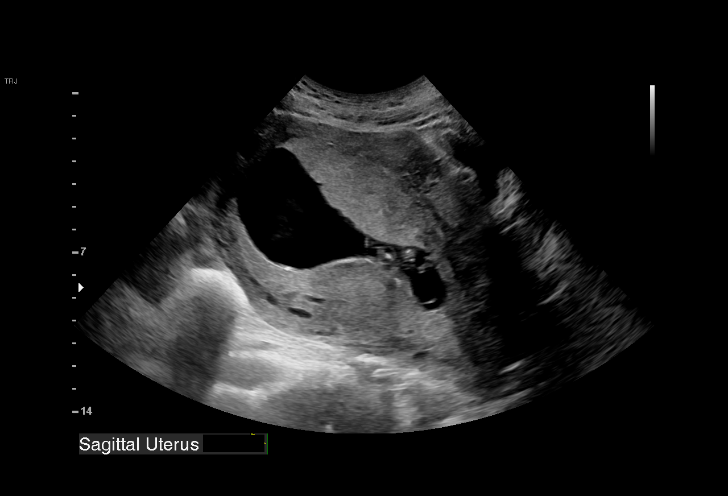
[im 3/32]
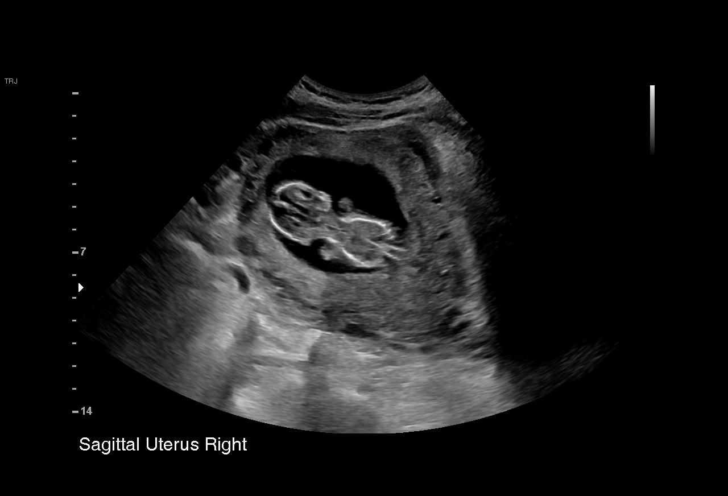
[im 5/32]
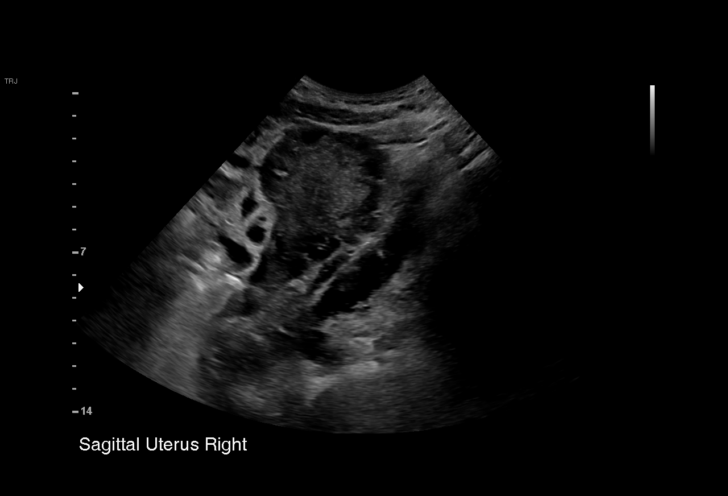
[im 7/32]
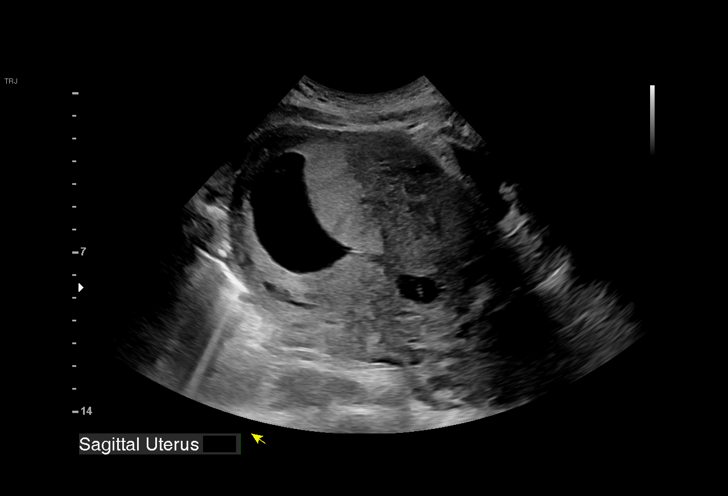
[im 10/32]
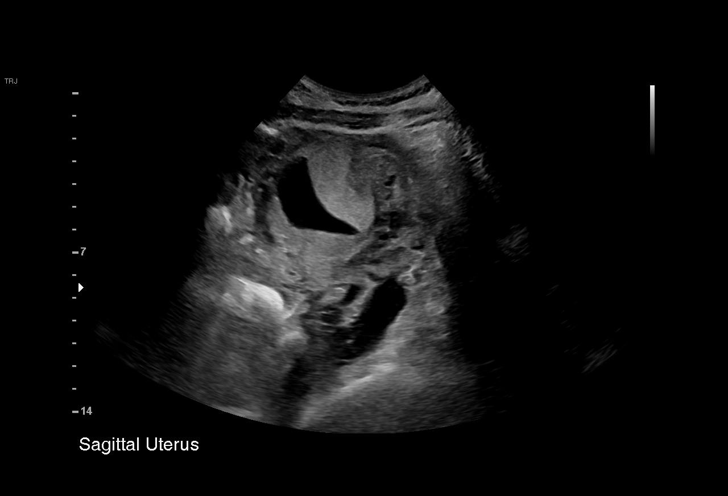
[im 12/32]
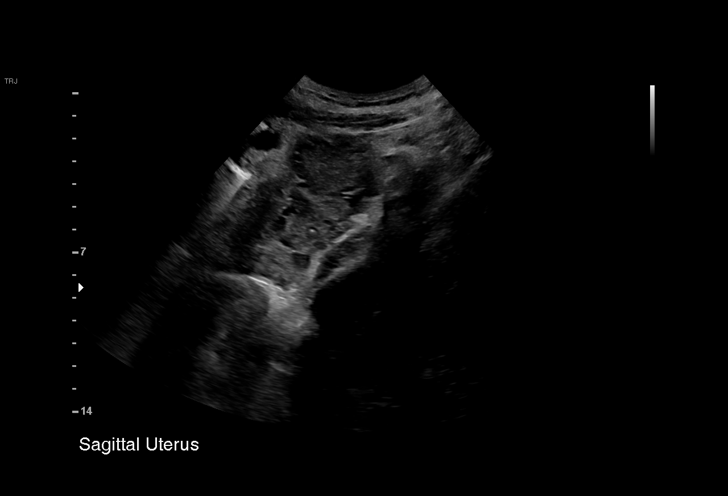
[im 14/32]
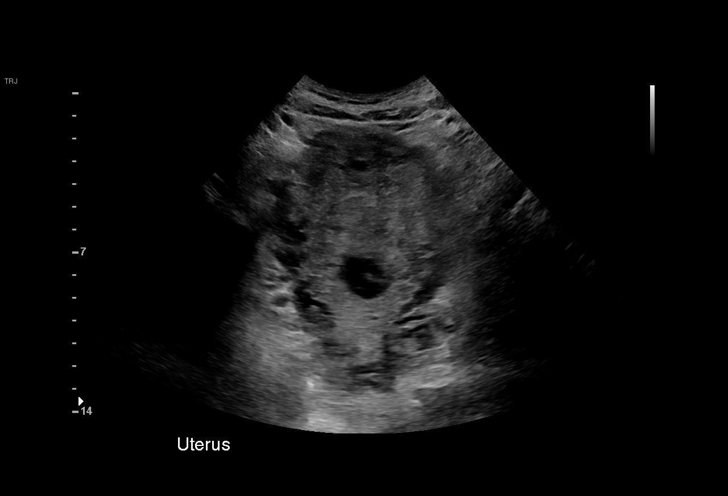
[im 17/32]
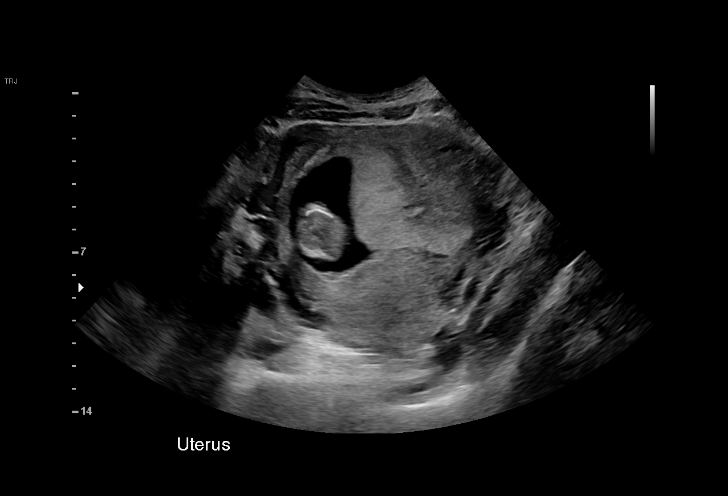
[im 18/32]
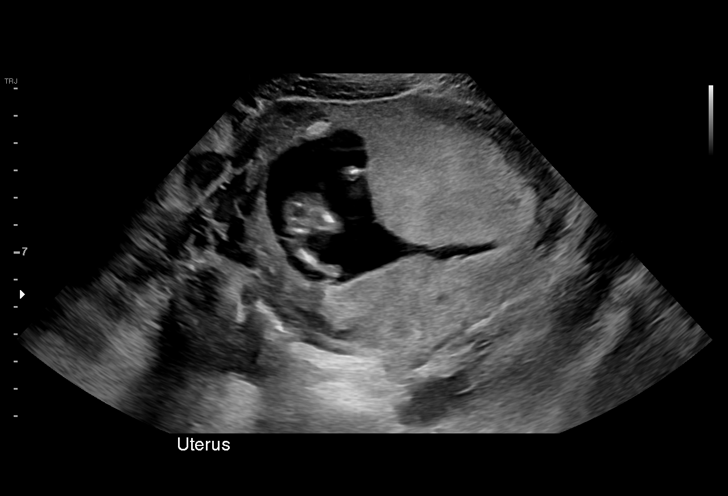
[im 20/32]
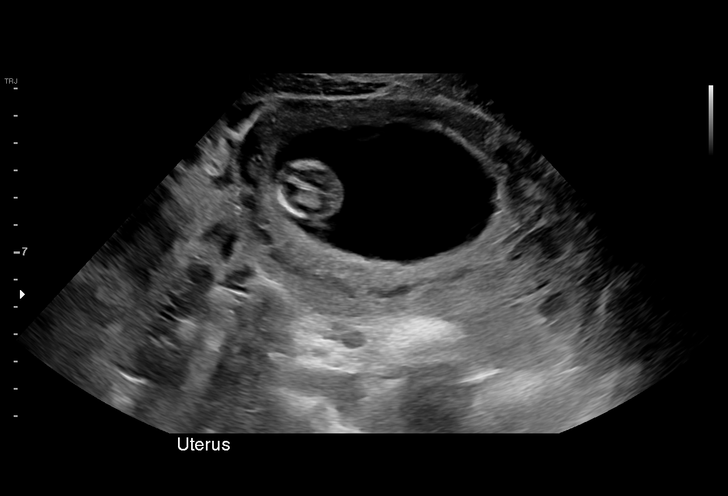
[im 22/32]
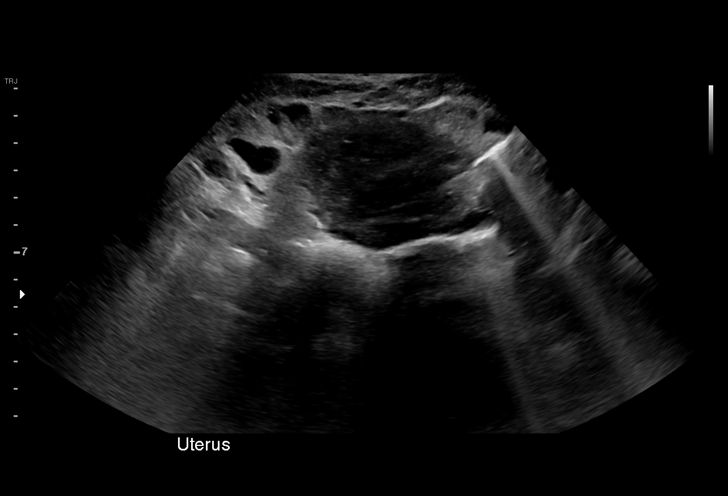
[im 25/32]
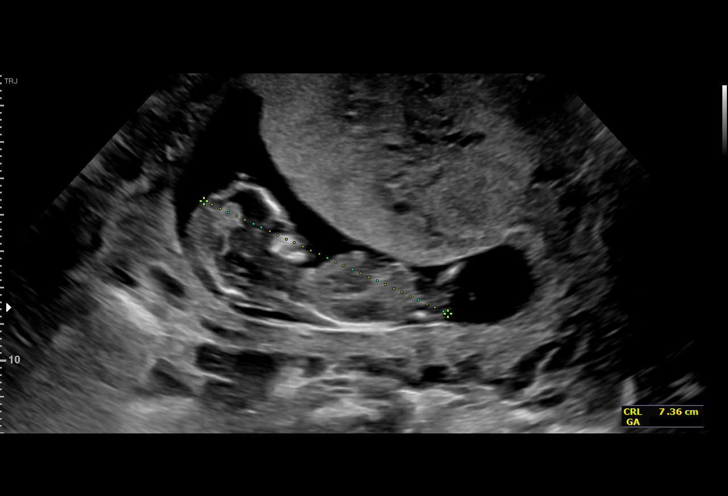
[im 27/32]
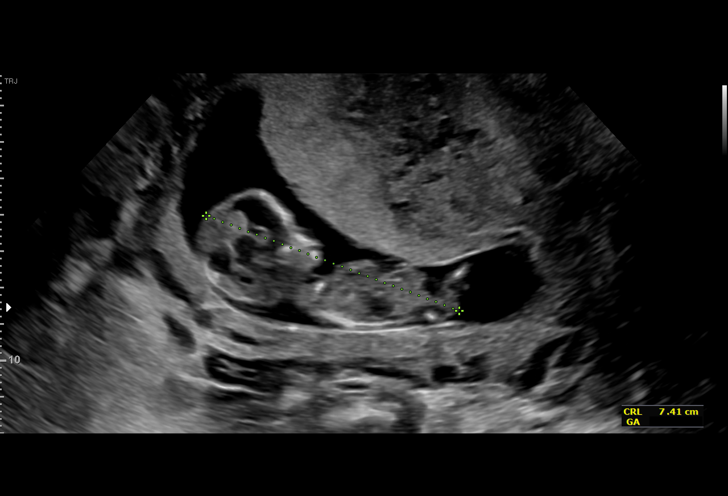
[im 29/32]
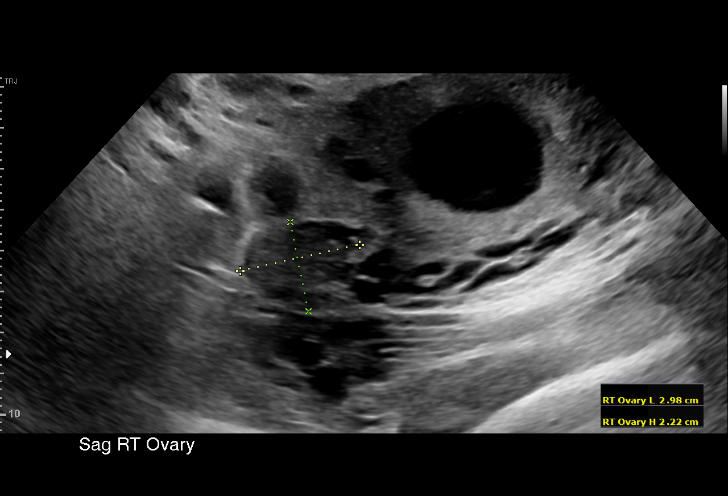
[im 32/32]
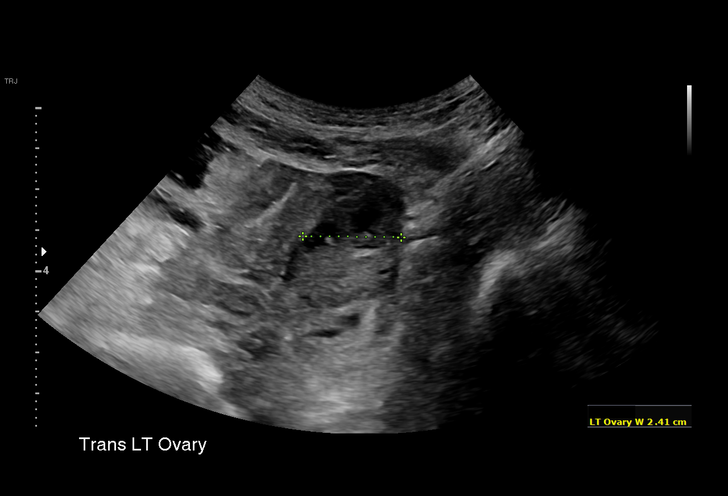

[15 of 28 positions shown; findings below may reference images not displayed]

FINDINGS: Intrauterine gestational sac: Present, single

Yolk sac:  Not visualized

Embryo:  Present

Cardiac Activity: Present

Heart Rate: 146 bpm

CRL:   73.5 mm   13 w 3 d                  US EDC: 01/22/2019

Subchorionic hemorrhage:  None visualized.

Maternal uterus/adnexae:

Developing placenta is anterior in position.

RIGHT ovary normal size and morphology 3.0 x 2.2 x 2.4 cm.

LEFT ovary measures 3.5 x 2.2 x 2.4 cm and contains a small corpus
luteum.

No free pelvic fluid or adnexal masses.
IMPRESSION: Single live intrauterine gestation at 13 weeks 3 days EGA by
crown-rump length.

No acute abnormalities.

## 2020-05-23 ENCOUNTER — Encounter (INDEPENDENT_AMBULATORY_CARE_PROVIDER_SITE_OTHER): Payer: Self-pay | Admitting: Family Medicine

## 2020-07-29 ENCOUNTER — Ambulatory Visit: Payer: Medicaid Other | Admitting: Nurse Practitioner

## 2020-07-31 ENCOUNTER — Ambulatory Visit: Payer: Self-pay | Admitting: Nurse Practitioner

## 2020-10-02 ENCOUNTER — Ambulatory Visit (INDEPENDENT_AMBULATORY_CARE_PROVIDER_SITE_OTHER): Payer: Medicaid Other | Admitting: Nurse Practitioner

## 2020-10-02 ENCOUNTER — Other Ambulatory Visit: Payer: Self-pay

## 2020-10-02 ENCOUNTER — Encounter: Payer: Self-pay | Admitting: Nurse Practitioner

## 2020-10-02 VITALS — BP 105/57 | HR 96 | Temp 98.0°F | Ht 67.0 in | Wt 225.0 lb

## 2020-10-02 DIAGNOSIS — Z3201 Encounter for pregnancy test, result positive: Secondary | ICD-10-CM | POA: Diagnosis not present

## 2020-10-02 DIAGNOSIS — Z331 Pregnant state, incidental: Secondary | ICD-10-CM | POA: Diagnosis not present

## 2020-10-02 DIAGNOSIS — Z862 Personal history of diseases of the blood and blood-forming organs and certain disorders involving the immune mechanism: Secondary | ICD-10-CM

## 2020-10-02 DIAGNOSIS — Z0000 Encounter for general adult medical examination without abnormal findings: Secondary | ICD-10-CM

## 2020-10-02 DIAGNOSIS — Z3490 Encounter for supervision of normal pregnancy, unspecified, unspecified trimester: Secondary | ICD-10-CM

## 2020-10-02 LAB — POCT URINALYSIS DIP (CLINITEK)
Glucose, UA: NEGATIVE mg/dL
Ketones, POC UA: NEGATIVE mg/dL
Leukocytes, UA: NEGATIVE
Nitrite, UA: NEGATIVE
POC PROTEIN,UA: 30 — AB
Spec Grav, UA: >=1.03 — AB (ref 1.010–1.025)
Urobilinogen, UA: 0.2 E.U./dL
pH, UA: 6.5 (ref 5.0–8.0)

## 2020-10-02 LAB — POCT URINE PREGNANCY: Preg Test, Ur: POSITIVE — AB

## 2020-10-02 NOTE — Patient Instructions (Addendum)
You were seen today in the [facility] to establish care and referral for Ob/Gyn. Labs were collected, results will be available via MyChart or, if abnormal, you will be contacted by clinic staff. Referral was sent to Ob/Gyn, they will call you with appointment. Please follow up in 1 yr for wellness exam        [facility] Address- [address], [place], [place] [address] Phone-[phone]

## 2020-10-02 NOTE — Progress Notes (Signed)
[facility] Patient [facility] [address] [place], [place]  [address] Phone:  [phone]   Fax:  [phone] Subjective:   Patient ID: Rachael Jones, female    DOB: 1980/02/23, 40 y.o.   MRN: [id number]  Chief Complaint  Patient presents with   Follow-up    Pt states that she is here to re-establish care and needs a referral to an OB/GYN. Pt is pregnant and doesn't know how far along she is.    HPI [facility] 40 y.o. female with history of pregnancy related anemia to the [facility] for referral for Ob/Gyn and to establish care. Patient states that last LMP in beginning of April. Pregnancy confirmed with at home pregnancy test. Has not been evaluated by Ob/Gyn or had [place] completed. Denies any pain, vaginal discharge, vaginal bleeding or other complications of pregnancy. Denies taking prenatal vitamins. Endorses daily morning sickness, declines treatment. Denies any other complaints.   Past Medical History:  Diagnosis Date   Complication of anesthesia    Epidurals did not work w/labor   Medical history non-contributory     Past Surgical History:  Procedure Laterality Date   NO PAST SURGERIES     VAGINAL DELIVERY     X 5   WISDOM TOOTH EXTRACTION      Family History  Problem Relation Age of Onset   Other Mother        healthy    Heart attack Father     Social History   Socioeconomic History   Marital status: Single    Spouse name: Not on file   Number of children: 5   Years of education: High School   Highest education level: 12th grade  Occupational History   Occupation: Unemployed  Tobacco Use   Smoking status: Former    Types: Cigarettes    Quit date: 12/22/1999    Years since quitting: 20.7   Smokeless tobacco: Never  Vaping Use   Vaping Use: Never used  Substance and Sexual Activity   Alcohol use: Not Currently    Comment: occas. glass of wine   Drug use: No   Sexual activity: Yes  Other Topics Concern   Not on file  Social History Narrative   Not  on file   Social Determinants of Health   Financial Resource Strain: Not on file  Food Insecurity: Not on file  Transportation Needs: Not on file  Physical Activity: Not on file  Stress: Not on file  Social Connections: Not on file  Intimate Partner Violence: Not on file    Outpatient Medications Prior to Visit  Medication Sig Dispense Refill   ferrous sulfate 325 (65 FE) MG tablet Take 1 tablet (325 mg total) by mouth 2 (two) times daily with a meal. (Patient not taking: Reported on 10/02/2020) 30 tablet 3   ibuprofen (ADVIL) 600 MG tablet Take 1 tablet (600 mg total) by mouth every 6 (six) hours. (Patient not taking: Reported on 10/02/2020) 30 tablet 0   No facility-administered medications prior to visit.    Allergies  Allergen Reactions   Other     Review of Systems  Constitutional:  Negative for chills, fever and malaise/fatigue.  Respiratory:  Negative for cough and shortness of breath.   Cardiovascular:  Negative for chest pain, palpitations and leg swelling.  Gastrointestinal:  Positive for nausea and vomiting. Negative for abdominal pain, blood in stool, constipation and diarrhea.  Genitourinary: Negative.   Skin: Negative.   Neurological: Negative.   Psychiatric/Behavioral:  Negative for depression. The patient is not nervous/anxious.   All other systems reviewed and are negative.     Objective:    Physical Exam Vitals reviewed.  Constitutional:      General: She is not in acute distress.    Appearance: Normal appearance.  HENT:     Head: Normocephalic.  Cardiovascular:     Rate and Rhythm: Normal rate and regular rhythm.     Pulses: Normal pulses.     Heart sounds: Normal heart sounds.     Comments: No obvious peripheral edema Pulmonary:     Effort: Pulmonary effort is normal.     Breath sounds: Normal breath sounds.  Skin:    General: Skin is warm and dry.     Capillary Refill: Capillary refill takes less than 2 seconds.  Neurological:     General:  No focal deficit present.     Mental Status: She is alert and oriented to person, place, and time.  Psychiatric:        Mood and Affect: Mood normal.        Behavior: Behavior normal.        Thought Content: Thought content normal.        Judgment: Judgment normal.    BP (!) 105/57   Pulse 96   Temp 98 F (36.7 C)   Ht 5\' 7"  (1.702 m)   Wt 225 lb (102.1 kg)   LMP  (LMP Unknown)   BMI 35.24 kg/m  Wt Readings from Last 3 Encounters:  10/02/20 225 lb (102.1 kg)  01/24/19 191 lb (86.6 kg)  01/19/19 190 lb 12.8 oz (86.5 kg)    Immunization History  Administered Date(s) Administered   Influenza,inj,Quad PF,6+ Mos 12/07/2014    Diabetic Foot Exam - Simple   No data filed     Lab Results  Component Value Date   TSH 3.590 06/28/2018   Lab Results  Component Value Date   WBC 8.5 01/24/2019   HGB 8.2 (L) 01/24/2019   HCT 27.0 (L) 01/24/2019   MCV 82.1 01/24/2019   PLT 280 01/24/2019   Lab Results  Component Value Date   NA 134 06/28/2018   K 4.1 06/28/2018   CO2 22 06/28/2018   GLUCOSE 77 06/28/2018   BUN 9 06/28/2018   CREATININE 0.68 06/28/2018   BILITOT <0.2 06/28/2018   ALKPHOS 45 06/28/2018   AST 21 06/28/2018   ALT 17 06/28/2018   PROT 7.0 06/28/2018   ALBUMIN 3.9 06/28/2018   CALCIUM 9.0 06/28/2018   ANIONGAP 10 03/17/2014   Lab Results  Component Value Date   CHOL 172 06/28/2018   Lab Results  Component Value Date   HDL 69 06/28/2018   Lab Results  Component Value Date   LDLCALC 92 06/28/2018   Lab Results  Component Value Date   TRIG 55 06/28/2018   Lab Results  Component Value Date   CHOLHDL 2.5 06/28/2018   Lab Results  Component Value Date   HGBA1C 5.2 06/28/2018       Assessment & Plan:   Problem List Items Addressed This Visit       Other   [redacted] weeks gestation of pregnancy - Primary Referral sent to [facility]. Patient also provided information for Local [facility] Encouraged prenatal  supplements Educated on diet and exercise during pregnancy           Other Visit Diagnoses     History of anemia  Relevant Orders   CBC with Differential   Comprehensive metabolic panel   Iron, TIBC and Ferritin Panel   Healthcare maintenance       Relevant Orders   Comprehensive metabolic panel   Lipid Panel   Patient to follow up in 1 yr ,for annual wellness exam, or sooner  as needed    I have discontinued Vella Brackins's ibuprofen. I am also having her maintain her ferrous sulfate.  No orders of the defined types were placed in this encounter.    Kathrynn Speed, NP

## 2020-10-03 ENCOUNTER — Ambulatory Visit: Payer: Self-pay | Admitting: Nurse Practitioner

## 2020-10-03 LAB — COMPREHENSIVE METABOLIC PANEL
ALT: 7 IU/L (ref 0–32)
AST: 15 IU/L (ref 0–40)
Albumin/Globulin Ratio: 1.2 (ref 1.2–2.2)
Albumin: 3.5 g/dL — ABNORMAL LOW (ref 3.8–4.8)
Alkaline Phosphatase: 88 IU/L (ref 44–121)
BUN/Creatinine Ratio: 10 (ref 9–23)
BUN: 7 mg/dL (ref 6–24)
Bilirubin Total: <0.2 mg/dL (ref 0.0–1.2)
CO2: 22 mmol/L (ref 20–29)
Calcium: 8.7 mg/dL (ref 8.7–10.2)
Chloride: 101 mmol/L (ref 96–106)
Creatinine, Ser: 0.7 mg/dL (ref 0.57–1.00)
Globulin, Total: 2.9 g/dL (ref 1.5–4.5)
Glucose: 74 mg/dL (ref 65–99)
Potassium: 4 mmol/L (ref 3.5–5.2)
Sodium: 136 mmol/L (ref 134–144)
Total Protein: 6.4 g/dL (ref 6.0–8.5)
eGFR: 112 mL/min/{1.73_m2} (ref >59)

## 2020-10-03 LAB — CBC WITH DIFFERENTIAL/PLATELET
Basophils Absolute: 0 10*3/uL (ref 0.0–0.2)
Basos: 0 %
EOS (ABSOLUTE): 0.4 10*3/uL (ref 0.0–0.4)
Eos: 6 %
Hematocrit: 31.7 % — ABNORMAL LOW (ref 34.0–46.6)
Hemoglobin: 10.4 g/dL — ABNORMAL LOW (ref 11.1–15.9)
Immature Grans (Abs): 0 10*3/uL (ref 0.0–0.1)
Immature Granulocytes: 1 %
Lymphocytes Absolute: 1.4 10*3/uL (ref 0.7–3.1)
Lymphs: 23 %
MCH: 28 pg (ref 26.6–33.0)
MCHC: 32.8 g/dL (ref 31.5–35.7)
MCV: 85 fL (ref 79–97)
Monocytes Absolute: 0.7 10*3/uL (ref 0.1–0.9)
Monocytes: 12 %
Neutrophils Absolute: 3.6 10*3/uL (ref 1.4–7.0)
Neutrophils: 58 %
Platelets: 269 10*3/uL (ref 150–450)
RBC: 3.71 x10E6/uL — ABNORMAL LOW (ref 3.77–5.28)
RDW: 13.4 % (ref 11.7–15.4)
WBC: 6.1 10*3/uL (ref 3.4–10.8)

## 2020-10-03 LAB — HUMAN CHORIONIC GONADOTROPIN(HCG),B-SUBUNIT,QUANTITATIVE): HCG, Beta Chain, Quant, S: 3662 m[IU]/mL

## 2020-10-03 LAB — LIPID PANEL
Chol/HDL Ratio: 3 ratio (ref 0.0–4.4)
Cholesterol, Total: 229 mg/dL — ABNORMAL HIGH (ref 100–199)
HDL: 76 mg/dL (ref >39)
LDL Chol Calc (NIH): 135 mg/dL — ABNORMAL HIGH (ref 0–99)
Triglycerides: 106 mg/dL (ref 0–149)
VLDL Cholesterol Cal: 18 mg/dL (ref 5–40)

## 2020-10-03 LAB — IRON,TIBC AND FERRITIN PANEL
Ferritin: 19 ng/mL (ref 15–150)
Iron Saturation: 6 % — CL (ref 15–55)
Iron: 26 ug/dL — ABNORMAL LOW (ref 27–159)
Total Iron Binding Capacity: 449 ug/dL (ref 250–450)
UIBC: 423 ug/dL (ref 131–425)

## 2020-10-22 ENCOUNTER — Telehealth: Payer: Self-pay

## 2020-10-22 NOTE — Telephone Encounter (Signed)
Left voicemail advising referral was refaxed.

## 2020-10-22 NOTE — Telephone Encounter (Signed)
Pt said she call [facility] and no referral was sent to them and she was inquiring about this   OB GYN referral

## 2020-11-25 LAB — OB RESULTS CONSOLE GBS: GBS: NEGATIVE

## 2020-11-25 LAB — OB RESULTS CONSOLE RPR: RPR: NONREACTIVE

## 2020-11-25 LAB — OB RESULTS CONSOLE HIV ANTIBODY (ROUTINE TESTING): HIV: NONREACTIVE

## 2020-11-25 LAB — OB RESULTS CONSOLE RUBELLA ANTIBODY, IGM: Rubella: IMMUNE

## 2020-11-25 LAB — HEPATITIS C ANTIBODY: HCV Ab: NEGATIVE

## 2020-11-25 LAB — OB RESULTS CONSOLE HEPATITIS B SURFACE ANTIGEN: Hepatitis B Surface Ag: NEGATIVE

## 2020-11-28 LAB — OB RESULTS CONSOLE GC/CHLAMYDIA: Gonorrhea: NEGATIVE

## 2020-12-25 ENCOUNTER — Other Ambulatory Visit: Payer: Self-pay | Admitting: Obstetrics & Gynecology

## 2020-12-30 ENCOUNTER — Inpatient Hospital Stay (HOSPITAL_COMMUNITY)
Admission: AD | Admit: 2020-12-30 | Payer: Medicaid Other | Source: Home / Self Care | Admitting: Obstetrics and Gynecology

## 2020-12-30 ENCOUNTER — Inpatient Hospital Stay (HOSPITAL_COMMUNITY): Payer: Medicaid Other

## 2020-12-31 ENCOUNTER — Other Ambulatory Visit: Payer: Self-pay

## 2020-12-31 ENCOUNTER — Inpatient Hospital Stay (HOSPITAL_COMMUNITY): Payer: Medicaid Other | Admitting: Anesthesiology

## 2020-12-31 ENCOUNTER — Inpatient Hospital Stay (HOSPITAL_COMMUNITY): Payer: Medicaid Other

## 2020-12-31 ENCOUNTER — Inpatient Hospital Stay (HOSPITAL_COMMUNITY)
Admission: AD | Admit: 2020-12-31 | Discharge: 2021-01-02 | DRG: 807 | Disposition: A | Discharge status: Home / Self Care | Payer: Medicaid Other | Attending: Obstetrics & Gynecology | Admitting: Obstetrics & Gynecology

## 2020-12-31 ENCOUNTER — Encounter (HOSPITAL_COMMUNITY): Payer: Self-pay | Admitting: Obstetrics & Gynecology

## 2020-12-31 DIAGNOSIS — O09529 Supervision of elderly multigravida, unspecified trimester: Secondary | ICD-10-CM

## 2020-12-31 DIAGNOSIS — O26893 Other specified pregnancy related conditions, third trimester: Principal | ICD-10-CM | POA: Diagnosis present

## 2020-12-31 DIAGNOSIS — Z3A40 40 weeks gestation of pregnancy: Secondary | ICD-10-CM

## 2020-12-31 DIAGNOSIS — D509 Iron deficiency anemia, unspecified: Secondary | ICD-10-CM | POA: Diagnosis present

## 2020-12-31 DIAGNOSIS — Z87891 Personal history of nicotine dependence: Secondary | ICD-10-CM | POA: Diagnosis not present

## 2020-12-31 DIAGNOSIS — Z20822 Contact with and (suspected) exposure to covid-19: Secondary | ICD-10-CM | POA: Diagnosis present

## 2020-12-31 DIAGNOSIS — O9902 Anemia complicating childbirth: Secondary | ICD-10-CM | POA: Diagnosis present

## 2020-12-31 LAB — CBC
HCT: 28.5 % — ABNORMAL LOW (ref 36.0–46.0)
Hemoglobin: 8.9 g/dL — ABNORMAL LOW (ref 12.0–15.0)
MCH: 25 pg — ABNORMAL LOW (ref 26.0–34.0)
MCHC: 31.2 g/dL (ref 30.0–36.0)
MCV: 80.1 fL (ref 80.0–100.0)
Platelets: 249 10*3/uL (ref 150–400)
RBC: 3.56 MIL/uL — ABNORMAL LOW (ref 3.87–5.11)
RDW: 16.3 % — ABNORMAL HIGH (ref 11.5–15.5)
WBC: 8.6 10*3/uL (ref 4.0–10.5)
nRBC: 0 % (ref 0.0–0.2)

## 2020-12-31 LAB — TYPE AND SCREEN
ABO/RH(D): A POS
Antibody Screen: NEGATIVE

## 2020-12-31 LAB — RESP PANEL BY RT-PCR (FLU A&B, COVID) ARPGX2
Influenza A by PCR: NEGATIVE
Influenza B by PCR: NEGATIVE
SARS Coronavirus 2 by RT PCR: NEGATIVE

## 2020-12-31 MED ORDER — ONDANSETRON HCL 4 MG/2ML IJ SOLN
4.0000 mg | Freq: Four times a day (QID) | INTRAMUSCULAR | Status: DC | PRN
Start: 2020-12-31 — End: 2021-01-01

## 2020-12-31 MED ORDER — TERBUTALINE SULFATE 1 MG/ML IJ SOLN: 0.2500 mg | Freq: Once | INTRAMUSCULAR | Status: DC | PRN

## 2020-12-31 MED ORDER — OXYTOCIN-SODIUM CHLORIDE 30-0.9 UT/500ML-% IV SOLN
INTRAVENOUS | Status: AC
  Administered 2021-01-01: 333 mL via INTRAVENOUS
  Filled 2020-12-31: qty 500

## 2020-12-31 MED ORDER — OXYTOCIN-SODIUM CHLORIDE 30-0.9 UT/500ML-% IV SOLN: 2.5000 [IU]/h | INTRAVENOUS | Status: DC

## 2020-12-31 MED ORDER — PHENYLEPHRINE 40 MCG/ML (10ML) SYRINGE FOR IV PUSH (FOR BLOOD PRESSURE SUPPORT): 80.0000 ug | PREFILLED_SYRINGE | INTRAVENOUS | Status: DC | PRN

## 2020-12-31 MED ORDER — LIDOCAINE HCL (PF) 1 % IJ SOLN: 30.0000 mL | INTRAMUSCULAR | Status: DC | PRN

## 2020-12-31 MED ORDER — EPHEDRINE 5 MG/ML INJ: 10.0000 mg | INTRAVENOUS | Status: DC | PRN

## 2020-12-31 MED ORDER — LIDOCAINE HCL (PF) 1 % IJ SOLN
INTRAMUSCULAR | Status: DC | PRN
  Administered 2020-12-31: 8 mL via EPIDURAL

## 2020-12-31 MED ORDER — OXYTOCIN BOLUS FROM INFUSION: 333.0000 mL | Freq: Once | INTRAVENOUS | Status: AC

## 2020-12-31 MED ORDER — OXYTOCIN-SODIUM CHLORIDE 30-0.9 UT/500ML-% IV SOLN
1.0000 m[IU]/min | INTRAVENOUS | Status: DC
  Administered 2020-12-31: 1 m[IU]/min via INTRAVENOUS

## 2020-12-31 MED ORDER — LACTATED RINGERS IV SOLN: INTRAVENOUS | Status: DC

## 2020-12-31 MED ORDER — DIPHENHYDRAMINE HCL 50 MG/ML IJ SOLN: 12.5000 mg | INTRAMUSCULAR | Status: DC | PRN

## 2020-12-31 MED ORDER — SOD CITRATE-CITRIC ACID 500-334 MG/5ML PO SOLN: 30.0000 mL | ORAL | Status: DC | PRN

## 2020-12-31 MED ORDER — LACTATED RINGERS IV SOLN
500.0000 mL | Freq: Once | INTRAVENOUS | Status: AC
  Administered 2020-12-31: 500 mL via INTRAVENOUS

## 2020-12-31 MED ORDER — FENTANYL-BUPIVACAINE-NACL 0.5-0.125-0.9 MG/250ML-% EP SOLN
12.0000 mL/h | EPIDURAL | Status: DC | PRN
  Administered 2020-12-31: 12 mL/h via EPIDURAL

## 2020-12-31 MED ORDER — LACTATED RINGERS IV SOLN: 500.0000 mL | INTRAVENOUS | Status: DC | PRN

## 2020-12-31 MED ORDER — OXYTOCIN-SODIUM CHLORIDE 30-0.9 UT/500ML-% IV SOLN
1.0000 m[IU]/min | INTRAVENOUS | Status: DC
  Administered 2020-12-31: 8 m[IU]/min via INTRAVENOUS

## 2020-12-31 MED ORDER — OXYTOCIN-SODIUM CHLORIDE 30-0.9 UT/500ML-% IV SOLN: 1.0000 m[IU]/min | INTRAVENOUS | Status: DC

## 2020-12-31 MED ORDER — FENTANYL-BUPIVACAINE-NACL 0.5-0.125-0.9 MG/250ML-% EP SOLN
EPIDURAL | Status: AC
  Filled 2020-12-31: qty 250

## 2020-12-31 MED ORDER — ACETAMINOPHEN 325 MG PO TABS: 650.0000 mg | ORAL_TABLET | ORAL | Status: DC | PRN

## 2020-12-31 NOTE — H&P (Addendum)
OB ADMISSION/ HISTORY & PHYSICAL:  Admission Date: 12/31/2020  3:38 PM  Admit Diagnosis: Induction of labor  Rachael Jones is a 40 y.o. female [id number] [redacted]w[redacted]d presenting for Induction of labor d/t AMA. Endorses active FM, denies LOF and vaginal bleeding. Occasional ctx.   History of current pregnancy: [id number]   Primary OB Provider: [facility] Patient entered care with [facility] at 33.3 wks.   EDC 12/30/20 by [place] @ 34.3wks. Unsure LMP Anatomy scan:  34.3 and 39.1wks, incomplete d/t advanced gestational age, posterior placenta.   Antenatal testing: for AMA, increased BMI of 36 started at 34 weeks Last evaluation: 39.1  wks EFW 7lbs 1 oz, 27th % Significant prenatal events:  BMI = 36 Late entry to [facility]  Patient Active Problem List   Diagnosis Date Noted   Encounter for induction of labor 01/24/2019   Iron deficiency anemia during pregnancy 10/20/2018   AMA (advanced maternal age) multigravida 35+ 10/19/2018   Supervision of other normal pregnancy, antepartum 07/11/2018   [redacted] weeks gestation of pregnancy 06/29/2018   Depression, major, single episode, moderate (HCC) 03/18/2014    Prenatal Labs: ABO, Rh: --/--/A POS (11/29 1610) Antibody: NEG (11/29 1610) Rubella: Immune (10/24 0000)  RPR: Nonreactive (10/24 0000)  HBsAg: Negative (10/24 0000)  HIV: Non-reactive (10/24 0000)  HgbA1C 5.5 GBS: Negative/-- (10/24 0000)   GC/CHL: Negative/Negative Genetics:  Tdap/influenza vaccines: Declined   OB History  Gravida Para Term Preterm AB Living  7 6 6     6   SAB IAB Ectopic Multiple Live Births        0 6    # Outcome Date GA Lbr Len/2nd Weight Sex Delivery Anes PTL Lv  7 Current           6 Term 01/24/19 [redacted]w[redacted]d 03:47 / 00:18 3631 g F Vag-Spont EPI  LIV  5 Term 11/01/08 [redacted]w[redacted]d   M Vag-Spont  N LIV  4 Term 01/01/06 [redacted]w[redacted]d   M Vag-Spont  N LIV  3 Term 10/03/03 [redacted]w[redacted]d    Vag-Spont  N LIV  2 Term 01/15/02 [redacted]w[redacted]d    Vag-Spont  N LIV  1 Term 08/19/00 [redacted]w[redacted]d    Vag-Spont  N LIV    Medical / Surgical  History: Past medical history:  Past Medical History:  Diagnosis Date   Complication of anesthesia    Epidurals did not work w/labor   Medical history non-contributory     Past surgical history:  Past Surgical History:  Procedure Laterality Date   NO PAST SURGERIES     VAGINAL DELIVERY     X 5   WISDOM TOOTH EXTRACTION     Family History:  Family History  Problem Relation Age of Onset   Other Mother        healthy    Heart attack Father     Social History:  reports that she quit smoking about 21 years ago. Her smoking use included cigarettes. She has never used smokeless tobacco. She reports that she does not currently use alcohol. She reports that she does not use drugs.  Allergies: Other   Current Medications at time of admission:  Prior to Admission medications   Medication Sig Start Date End Date Taking? Authorizing Provider  ferrous sulfate 325 (65 FE) MG tablet Take 1 tablet (325 mg total) by mouth 2 (two) times daily with a meal. Patient not taking: Reported on 10/02/2020 01/26/19   01/28/19, CNM    Review of Systems: Constitutional: Negative   HENT: Negative  Eyes: Negative   Respiratory: Negative   Cardiovascular: Negative   Gastrointestinal: Negative  Genitourinary: Negative for bloody show, Negative for LOF   Musculoskeletal: Negative   Skin: Negative   Neurological: Negative   Endo/Heme/Allergies: Negative   Psychiatric/Behavioral: Negative    Physical Exam: VS: Blood pressure 112/62, pulse 87, temperature 98.4 F (36.9 C), temperature source Oral, resp. rate 16, height 5\' 7"  (1.702 m), weight 105.1 kg, unknown if currently breastfeeding. AAO x3, no signs of distress Cardiovascular: RRR Respiratory: Lung fields clear to ausculation GU/GI: Abdomen gravid, non-tender, non-distended, active FM, vertex, EFW 7lbs per Leopold's Extremities: negative edema, negative for pain, tenderness, and cords  Cervical exam 1/75/-3 ballotable   FHR: baseline rate 145 / variability moderate/ accelerations present / negative decelerations TOCO: occasional   Prenatal Transfer Tool  Maternal Diabetes: No Genetic Screening: No Maternal Ultrasounds/Referrals: Normal Fetal Ultrasounds or other Referrals:  None Maternal Substance Abuse:  No Significant Maternal Medications:  None Significant Maternal Lab Results: Group B Strep negative    Assessment: 40 y.o. 41 [redacted]w[redacted]d IOL for AMA.  BMI=36 BS U/S done. Vertex, direct OP  Induction of labor FHR category 1 GBS negative Pain management plan: epidural   Plan:  Admit to L&D Routine admission orders Pitocin 1x1 max of 60mu Epidural PRN  Dr 11m notified of admission and plan of care  Mora Appl MSN, CNM 12/31/2020 5:16 PM

## 2020-12-31 NOTE — Anesthesia Preprocedure Evaluation (Signed)
Anesthesia Evaluation  Patient identified by MRN, date of birth, ID band Patient awake    Reviewed: Allergy & Precautions, NPO status , Patient's Chart, lab work & pertinent test results  Airway Mallampati: II  TM Distance: >3 FB Neck ROM: Full    Dental no notable dental hx.    Pulmonary former smoker,    Pulmonary exam normal        Cardiovascular negative cardio ROS Normal cardiovascular exam     Neuro/Psych PSYCHIATRIC DISORDERS Depression negative neurological ROS     GI/Hepatic negative GI ROS, Neg liver ROS,   Endo/Other  negative endocrine ROS  Renal/GU negative Renal ROS     Musculoskeletal negative musculoskeletal ROS (+)   Abdominal   Peds  Hematology  (+) anemia ,      Anesthesia Other Findings   Reproductive/Obstetrics (+) Pregnancy                             Anesthesia Physical  Anesthesia Plan  ASA: 2  Anesthesia Plan: Epidural   Post-op Pain Management:    Induction:   PONV Risk Score and Plan: 2 and Treatment may vary due to age or medical condition  Airway Management Planned: Natural Airway  Additional Equipment: None  Intra-op Plan:   Post-operative Plan:   Informed Consent: I have reviewed the patients History and Physical, chart, labs and discussed the procedure including the risks, benefits and alternatives for the proposed anesthesia with the patient or authorized representative who has indicated his/her understanding and acceptance.       Plan Discussed with: Anesthesiologist  Anesthesia Plan Comments: (Labs reviewed. Platelets acceptable, patient not taking any blood thinning medications. Per RN, FHR tracing reported to be stable enough for sitting procedure. Risks and benefits discussed with patient, including PDPH, backache, epidural hematoma, failed epidural, blood pressure changes, allergic reaction, and nerve injury. Patient expressed  understanding and wished to proceed.)        Anesthesia Quick Evaluation

## 2020-12-31 NOTE — Progress Notes (Addendum)
Johnnette Laux is a 40 y.o. L3Y1017 at [redacted]w[redacted]d admitted for induction of labor due to [facility].  Subjective:  Daron Offer is feeling painful ctxs. "5/10". Requested SVE. 3-4/80/-2. Thinking about epidural placement at present time.  Objective: BP (!) 111/57   Pulse 84   Temp 98.4 F (36.9 C) (Oral)   Resp 16   Ht 5\' 7"  (1.702 m)   Wt 105.1 kg   LMP  (LMP Unknown)   BMI 36.29 kg/m  No intake/output data recorded. No intake/output data recorded.  FHT:  FHR: 150 bpm, variability: moderate,  accelerations:  Present,  decelerations:  Absent UC:   regular, every 1-4 minutes SVE:   Dilation: 3.5 Effacement (%): 80 Station: -2 Exam by:: [email or web address]  Labs: Lab Results  Component Value Date   WBC 8.6 12/31/2020   HGB 8.9 (L) 12/31/2020   HCT 28.5 (L) 12/31/2020   MCV 80.1 12/31/2020   PLT 249 12/31/2020    Assessment / Plan: Induction of labor due to AMA,  progressing well on pitocin Vetex, well applied to cervix  Labor: Progressing on Pitocin, will continue to increase then AROM Titrate 2x2 Fetal Wellbeing:  Category I Pain Control:  Epidural PRN I/D:   GBS negative Anticipated MOD:  NSVD Dr 01/02/2021 updated on pt status and POC  Su Hilt Olamae Ferrara MSN, CNM 12/31/2020, 9:08 PM

## 2020-12-31 NOTE — Anesthesia Procedure Notes (Signed)
Epidural Patient location during procedure: OB Start time: 12/31/2020 10:12 PM End time: 12/31/2020 10:22 PM  Staffing Anesthesiologist: Mellody Dance, MD Performed: anesthesiologist   Preanesthetic Checklist Completed: patient identified, IV checked, site marked, risks and benefits discussed, monitors and equipment checked, pre-op evaluation and timeout performed  Epidural Patient position: sitting Prep: DuraPrep Patient monitoring: heart rate, cardiac monitor, continuous pulse ox and blood pressure Approach: midline Location: L2-L3 Injection technique: LOR saline  Needle:  Needle type: Tuohy  Needle gauge: 17 G Needle length: 9 cm Needle insertion depth: 4.5 cm Catheter type: closed end flexible Catheter size: 20 Guage Catheter at skin depth: 9.5 cm Test dose: negative and Other  Assessment Events: blood not aspirated, injection not painful, no injection resistance and negative IV test  Additional Notes Informed consent obtained prior to proceeding including risk of failure, 1% risk of PDPH, risk of minor discomfort and bruising.  Discussed rare but serious complications including epidural abscess, permanent nerve injury, epidural hematoma.  Discussed alternatives to epidural analgesia and patient desires to proceed.  Timeout performed pre-procedure verifying patient name, procedure, and platelet count.  Patient tolerated procedure well.

## 2021-01-01 ENCOUNTER — Encounter (HOSPITAL_COMMUNITY): Payer: Self-pay | Admitting: Obstetrics & Gynecology

## 2021-01-01 LAB — CBC
HCT: 27.3 % — ABNORMAL LOW (ref 36.0–46.0)
Hemoglobin: 8.6 g/dL — ABNORMAL LOW (ref 12.0–15.0)
MCH: 25.3 pg — ABNORMAL LOW (ref 26.0–34.0)
MCHC: 31.5 g/dL (ref 30.0–36.0)
MCV: 80.3 fL (ref 80.0–100.0)
Platelets: 221 10*3/uL (ref 150–400)
RBC: 3.4 MIL/uL — ABNORMAL LOW (ref 3.87–5.11)
RDW: 16.4 % — ABNORMAL HIGH (ref 11.5–15.5)
WBC: 10 10*3/uL (ref 4.0–10.5)
nRBC: 0 % (ref 0.0–0.2)

## 2021-01-01 LAB — RPR: RPR Ser Ql: NONREACTIVE

## 2021-01-01 MED ORDER — OXYCODONE HCL 5 MG PO TABS
5.0000 mg | ORAL_TABLET | ORAL | Status: DC | PRN
Start: 2021-01-01 — End: 2021-01-03
  Administered 2021-01-01 – 2021-01-02 (×3): 10 mg via ORAL
  Filled 2021-01-01 (×3): qty 2

## 2021-01-01 MED ORDER — WITCH HAZEL-GLYCERIN EX PADS: 1.0000 "application " | MEDICATED_PAD | CUTANEOUS | Status: DC | PRN

## 2021-01-01 MED ORDER — BENZOCAINE-MENTHOL 20-0.5 % EX AERO: 1.0000 "application " | INHALATION_SPRAY | CUTANEOUS | Status: DC | PRN

## 2021-01-01 MED ORDER — SENNOSIDES-DOCUSATE SODIUM 8.6-50 MG PO TABS
2.0000 | ORAL_TABLET | Freq: Every day | ORAL | Status: DC
  Administered 2021-01-02: 2 via ORAL
  Filled 2021-01-01: qty 2

## 2021-01-01 MED ORDER — DIBUCAINE (PERIANAL) 1 % EX OINT: 1.0000 "application " | TOPICAL_OINTMENT | CUTANEOUS | Status: DC | PRN

## 2021-01-01 MED ORDER — ONDANSETRON HCL 4 MG/2ML IJ SOLN: 4.0000 mg | INTRAMUSCULAR | Status: DC | PRN

## 2021-01-01 MED ORDER — POLYSACCHARIDE IRON COMPLEX 150 MG PO CAPS
150.0000 mg | ORAL_CAPSULE | Freq: Every day | ORAL | Status: DC
  Administered 2021-01-01 – 2021-01-02 (×2): 150 mg via ORAL
  Filled 2021-01-01 (×2): qty 1

## 2021-01-01 MED ORDER — ONDANSETRON HCL 4 MG PO TABS: 4.0000 mg | ORAL_TABLET | ORAL | Status: DC | PRN

## 2021-01-01 MED ORDER — SIMETHICONE 80 MG PO CHEW: 80.0000 mg | CHEWABLE_TABLET | ORAL | Status: DC | PRN

## 2021-01-01 MED ORDER — IBUPROFEN 600 MG PO TABS
600.0000 mg | ORAL_TABLET | Freq: Four times a day (QID) | ORAL | Status: DC
  Administered 2021-01-01 – 2021-01-02 (×5): 600 mg via ORAL
  Filled 2021-01-01 (×7): qty 1

## 2021-01-01 MED ORDER — ZOLPIDEM TARTRATE 5 MG PO TABS: 5.0000 mg | ORAL_TABLET | Freq: Every evening | ORAL | Status: DC | PRN

## 2021-01-01 MED ORDER — DIPHENHYDRAMINE HCL 25 MG PO CAPS: 25.0000 mg | ORAL_CAPSULE | Freq: Four times a day (QID) | ORAL | Status: DC | PRN

## 2021-01-01 MED ORDER — COCONUT OIL OIL: 1.0000 "application " | TOPICAL_OIL | Status: DC | PRN

## 2021-01-01 MED ORDER — ACETAMINOPHEN 325 MG PO TABS
650.0000 mg | ORAL_TABLET | ORAL | Status: DC | PRN
  Administered 2021-01-01: 650 mg via ORAL
  Filled 2021-01-01 (×2): qty 2

## 2021-01-01 MED ORDER — TETANUS-DIPHTH-ACELL PERTUSSIS 5-2.5-18.5 LF-MCG/0.5 IM SUSY: 0.5000 mL | PREFILLED_SYRINGE | Freq: Once | INTRAMUSCULAR | Status: DC

## 2021-01-01 MED ORDER — PRENATAL MULTIVITAMIN CH
1.0000 | ORAL_TABLET | Freq: Every day | ORAL | Status: DC
  Administered 2021-01-01 – 2021-01-02 (×2): 1 via ORAL
  Filled 2021-01-01 (×2): qty 1

## 2021-01-01 NOTE — Anesthesia Postprocedure Evaluation (Signed)
Anesthesia Post Note  Patient: [name]  Procedure(s) Performed: AN AD HOC LABOR EPIDURAL     Patient location during evaluation: Mother Baby Anesthesia Type: Epidural Level of consciousness: awake, oriented and awake and alert Pain management: pain level not controlled (patient has headache and muscle pain in neck. Asked RN to add tylenol and recommeneded to patient to hydrate well. Will check back to see if headache decreasing.) Vital Signs Assessment: post-procedure vital signs reviewed and stable Respiratory status: spontaneous breathing, respiratory function stable and nonlabored ventilation Cardiovascular status: stable Postop Assessment: spinal receding (See note under pain management. Anesthesia to check back later today to see if headache pain decreasing.) Anesthetic complications: no   No notable events documented.  Last Vitals:  Vitals:   01/01/21 0400 01/01/21 0802  BP: 120/69 99/73  Pulse: 85 86  Resp: 18 16  Temp: 36.7 C 36.7 C  SpO2: 100% 96%    Last Pain:  Vitals:   01/01/21 0802  TempSrc: Oral  PainSc: 0-No pain   Pain Goal:                   Rachael Jones

## 2021-01-01 NOTE — Anesthesia Postprocedure Evaluation (Signed)
Anesthesia Post Note  Patient: Rachael Jones  Procedure(s) Performed: AN AD HOC LABOR EPIDURAL     Patient location during evaluation: Mother Baby Anesthesia Type: Epidural Level of consciousness: awake, oriented and awake and alert Pain management: pain level controlled (Headache improving. Complaining of back being sore all over. Asked RN to bring her heating pad and tylenol.) Vital Signs Assessment: post-procedure vital signs reviewed and stable Respiratory status: spontaneous breathing, respiratory function stable and nonlabored ventilation Cardiovascular status: stable Postop Assessment: adequate PO intake, able to ambulate, patient able to bend at knees and no apparent nausea or vomiting (patient's headache improving but back sore all over from muscle discomfort. ) Anesthetic complications: no   No notable events documented.  Last Vitals:  Vitals:   01/01/21 1227 01/01/21 1604  BP: 101/63 101/65  Pulse: 70 75  Resp: 16 16  Temp: 36.6 C 36.6 C  SpO2: 100% 100%    Last Pain:  Vitals:   01/01/21 1604  TempSrc: Oral  PainSc: 7    Pain Goal:                   Rjay Revolorio

## 2021-01-01 NOTE — Lactation Note (Signed)
This note was copied from a baby's chart. Lactation Consultation Note  Patient Name: Rachael Jones HMCNO'B Date: 01/01/2021 Reason for consult: Follow-up assessment;Mother's request;Breastfeeding assistance Age:40 hours  Maternal Data Does the patient have breastfeeding experience prior to this delivery?: Yes How long did the patient breastfeed?: Mom still nursing  last child at 1 year prior to delivery  Mom states BF going well. LC updated urine stool output in chart. Mom denies any pain with the latch. Infant last feeding 45 min.   Mom asked to be placed prn on LC services. Mom declined needing any assistance with breastfeeding at the time of LC visit.   Feeding Mother's Current Feeding Choice: Breast Milk  LATCH Score                    Lactation Tools Discussed/Used    Interventions Interventions: Breast feeding basics reviewed;Education;Infant Driven Feeding Algorithm education  Discharge    Consult Status Consult Status: PRN Date: 01/02/21 Follow-up type: In-patient    Rachael Jones  [name] 01/01/2021, 10:51 PM

## 2021-01-01 NOTE — Lactation Note (Signed)
This note was copied from a baby's chart. Lactation Consultation Note  Patient Name: Boy Valentine Kuechle LRJPV'G Date: 01/01/2021 Reason for consult: Initial assessment;Term Age:40 hours Per mom the baby recently fed. See doc flow sheets.  LC reviewed the doc flow sheets / all documented.  Mom is and experienced BF, per mom my [age] year old is still BF, especially when she is tired. LC discussed the importance of when she goes home with the newborn / always meet the newborns feeding needs 1st.  LC plan - feed with feeding cues / ( 8-12 times in 24 hours ) - STS .  LC asked mom to call with feeding cues for Latch assessment.    Maternal Data Does the patient have breastfeeding experience prior to this delivery?: Yes How long did the patient breastfeed?: per mom breast feeding range 40 months > 1 year  Feeding Mother's Current Feeding Choice: Breast Milk  LATCH Score                    Lactation Tools Discussed/Used    Interventions    Discharge Pump: Personal;DEBP WIC Program: Yes  Consult Status Consult Status: Follow-up Date: 01/01/21 Follow-up type: In-patient    Matilde Sprang Charmaine Placido 01/01/2021, 8:57 AM

## 2021-01-02 MED ORDER — OXYCODONE-ACETAMINOPHEN 5-325 MG PO TABS: 1.0000 | ORAL_TABLET | ORAL | 0 refills | Status: AC | PRN

## 2021-01-02 MED ORDER — PRENATAL MULTIVITAMIN CH: 1.0000 | ORAL_TABLET | Freq: Every day | ORAL | 3 refills | Status: AC

## 2021-01-02 MED ORDER — IBUPROFEN 600 MG PO TABS: 600.0000 mg | ORAL_TABLET | Freq: Four times a day (QID) | ORAL | 1 refills | Status: AC | PRN

## 2021-01-02 NOTE — Progress Notes (Signed)
Pt about to get into the shower. She called out and asked for her IV to come out. I checked and she does not have repeat blood work ordered for this AM. I did talk to her about the possibility of leaving it in until she sees her provider this morning to make sure she does not plan on starting IV iron since hemoglobin was low. I answered patient's questions but she is still requesting for it to come out. Removed and educated.
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
## 2021-01-02 NOTE — Discharge Summary (Signed)
[facility] Discharge Summary   Patient Name:   Rachael Jones DOB:     [birth year] MRN:     [id number]  Date of Admission:   12/31/2020 Date of Discharge:  01/02/2021  Admitting diagnosis:    AMA (advanced maternal age) multigravida 41+ [O09.529] Principal Problem:   AMA (advanced maternal age) multigravida 35+     Discharge diagnosis:    AMA (advanced maternal age) multigravida 35+ [O09.529] Principal Problem:   AMA (advanced maternal age) multigravida 35+   Additional problems: Anemia of pregnancy    Discharge diagnosis: Term Pregnancy Delivered                                            Post partum procedures: n/a Augmentation: AROM and Pitocin Complications: None  Hospital course: Induction of Labor With Vaginal Delivery   40 y.o. yo Rachael Jones at 42w1dwas admitted to the hospital 12/31/2020 for induction of labor.  Indication for induction: AMA.  Patient had an uncomplicated labor course as follows: Membrane Rupture Time/Date: 12:50 AM ,01/01/2021   Delivery Method:Vaginal, Spontaneous  Episiotomy: None  Lacerations:  None  Details of delivery can be found in separate delivery note.  Patient had a routine postpartum course. Patient is discharged home 01/02/21.  Newborn Data: Birth date:01/01/2021  Birth time:1:08 AM  Gender:Female  Living status:Living  Apgars:9 ,9  Weight:3195 g   Magnesium Sulfate received: No BMZ received: No Rhophylac:No MMR:No T-DaP:Given prenatally Flu: No Transfusion:No                                                               Post partum procedures: none  Type of Delivery:  NSVD  Delivering Provider: [place] Pulse  Date of Delivery:  01/01/21  Newborn Data:  Baby's Name:  "Rachael Jones Baby Feeding:   Bottle and Breast Disposition:   home with mother  Complications:   None Birth date:01/01/2021  Birth time:1:08 AM  Gender:Female  Living status:Living  Apgars:9 ,9  Weight:3195 g   Newborn Delivery    Birth date/time: 01/01/2021 01:08:00 Delivery type: Vaginal, Spontaneous       Physical Exam:   Vitals:   01/01/21 1604 01/01/21 2020 01/02/21 0507 01/02/21 1530  BP: 101/65 112/69 114/64 108/68  Pulse: 75 70 82 70  Resp: _0 Temp: 97.9 F (36.6 C) 97.9 F (36.6 C) 98 F (36.7 C) 98.7 F (37.1 C)  TempSrc: Oral Oral Oral Oral  SpO2: 100%     Weight:      Height:       General: alert, cooperative, and no distress Lochia: appropriate Uterine Fundus: firm Incision: N/A DVT Evaluation: No evidence of DVT seen on physical exam. Negative Homan's sign. No cords or calf tenderness.  Labs: Lab Results  Component Value Date   WBC 10.0 01/01/2021   HGB 8.6 (L) 01/01/2021   HCT 27.3 (L) 01/01/2021   MCV 80.3 01/01/2021   PLT 221 01/01/2021   CMP Latest Ref Rng & Units 10/02/2020  Glucose 65 - 99 mg/dL 74  BUN 6 - 24 mg/dL 7  Creatinine 0.57 - 1.00 mg/dL 0.70  Sodium 134 - 144  mmol/L 136  Potassium 3.5 - 5.2 mmol/L 4.0  Chloride 96 - 106 mmol/L 101  CO2 20 - 29 mmol/L 22  Calcium 8.7 - 10.2 mg/dL 8.7  Total Protein 6.0 - 8.5 g/dL 6.4  Total Bilirubin 0.0 - 1.2 mg/dL <0.2  Alkaline Phos 44 - 121 IU/L 88  AST 0 - 40 IU/L 15  ALT 0 - 32 IU/L 7    Discharge instruction: per After Visit Summary and "Baby and Me Booklet".  After Visit Meds:  Allergies as of 01/02/2021       Reactions   Other         Medication List     TAKE these medications    ferrous sulfate 325 (65 FE) MG tablet Take 1 tablet (325 mg total) by mouth 2 (two) times daily with a meal.   ibuprofen 600 MG tablet Commonly known as: ADVIL Take 1 tablet (600 mg total) by mouth every 6 (six) hours as needed for moderate pain or cramping.   oxyCODONE-acetaminophen 5-325 MG tablet Commonly known as: PERCOCET/ROXICET Take 1 tablet by mouth every 4 (four) hours as needed for severe pain.   prenatal multivitamin Tabs tablet Take 1 tablet by mouth daily at 12 noon. Start taking on:  January 03, 2021        Diet: routine diet  Activity: Advance as tolerated. Pelvic rest for 6 weeks.   Outpatient follow up:6 weeks Follow up Appt: Future Appointments  Date Time Provider [place]  10/03/2021  9:00 AM Bo Merino I, NP [facility] None   Follow up visit: No follow-ups on file.  Postpartum contraception: Undecided  01/02/2021 Sanjuana Kava, MD

## 2021-01-14 ENCOUNTER — Telehealth (HOSPITAL_COMMUNITY): Payer: Self-pay | Admitting: *Deleted

## 2021-01-14 NOTE — Telephone Encounter (Signed)
Patient voiced no questions or concerns at this time. EPDS=5. Patient voiced no questions or concerns regarding infant at this time. Patient reports infant sleeps in a bassinet on his back. RN reviewed ABCs of safe sleep. Patient verbalized understanding. Patient requested RN email information on hospital's virtual Baby and Me class. Email sent. Deforest Hoyles, RN, 01/14/21, [phone]

## 2021-10-03 ENCOUNTER — Encounter: Payer: Self-pay | Admitting: Nurse Practitioner

## 2022-07-02 ENCOUNTER — Encounter (HOSPITAL_COMMUNITY): Payer: Self-pay

## 2022-07-02 ENCOUNTER — Other Ambulatory Visit: Payer: Self-pay

## 2022-07-02 ENCOUNTER — Emergency Department (HOSPITAL_COMMUNITY)
Admission: EM | Admit: 2022-07-02 | Discharge: 2022-07-03 | Disposition: A | Discharge status: Home / Self Care | Payer: Medicaid Other | Attending: Emergency Medicine | Admitting: Emergency Medicine

## 2022-07-02 DIAGNOSIS — R102 Pelvic and perineal pain: Secondary | ICD-10-CM | POA: Diagnosis present

## 2022-07-02 DIAGNOSIS — B9689 Other specified bacterial agents as the cause of diseases classified elsewhere: Secondary | ICD-10-CM | POA: Diagnosis not present

## 2022-07-02 DIAGNOSIS — N760 Acute vaginitis: Secondary | ICD-10-CM | POA: Diagnosis not present

## 2022-07-02 NOTE — ED Triage Notes (Signed)
Pt has painless abscess near vaginal opening. Two days ago pt was having some pain to the area but now there is no pain. No bleeding of discharge

## 2022-07-03 LAB — GC/CHLAMYDIA PROBE AMP (~~LOC~~) NOT AT ARMC
Chlamydia: NEGATIVE
Comment: NEGATIVE
Comment: NORMAL
Neisseria Gonorrhea: NEGATIVE

## 2022-07-03 LAB — WET PREP, GENITAL
Sperm: NONE SEEN
Trich, Wet Prep: NONE SEEN
WBC, Wet Prep HPF POC: <10 (ref <10)
Yeast Wet Prep HPF POC: NONE SEEN

## 2022-07-03 MED ORDER — METRONIDAZOLE 500 MG PO TABS: 500.0000 mg | ORAL_TABLET | Freq: Two times a day (BID) | ORAL | 0 refills | Status: AC

## 2022-07-03 NOTE — ED Provider Notes (Signed)
[place] EMERGENCY DEPARTMENT AT [facility] Provider Note   CSN: [id number] Arrival date & time: 07/02/22  2259     History  Chief Complaint  Patient presents with   Abscess    Rachael Jones is a 42 y.o. female.  42 year old female presents with complaint of abnormal genitalia. Patient states she noticed some discomfort along her bikini line area that prompted her to use a hand mirror and take a closer look at her genitals. Patient noted a bulge/fullness that is non painful. Also notes vaginal odor, no new partners. No other complaints or concerns. Unable to see her GYN until 07/06/22.       Home Medications Prior to Admission medications   Medication Sig Start Date End Date Taking? Authorizing Provider  metroNIDAZOLE (FLAGYL) 500 MG tablet Take 1 tablet (500 mg total) by mouth 2 (two) times daily. 07/03/22  Yes Jeannie Fend, PA-C  ferrous sulfate 325 (65 FE) MG tablet Take 1 tablet (325 mg total) by mouth 2 (two) times daily with a meal. Patient not taking: Reported on 10/02/2020 01/26/19   Marylene Land, CNM  ibuprofen (ADVIL) 600 MG tablet Take 1 tablet (600 mg total) by mouth every 6 (six) hours as needed for moderate pain or cramping. 01/02/21   Essie Hart, MD  oxyCODONE-acetaminophen (PERCOCET/ROXICET) 5-325 MG tablet Take 1 tablet by mouth every 4 (four) hours as needed for severe pain. 01/02/21   Essie Hart, MD  Prenatal Vit-Fe Fumarate-FA (PRENATAL MULTIVITAMIN) TABS tablet Take 1 tablet by mouth daily at 12 noon. 01/03/21   Essie Hart, MD      Allergies    Other    Review of Systems   Review of Systems Negative except as per HPI Physical Exam Updated Vital Signs BP 107/85 (BP Location: Right Arm)   Pulse 75   Temp 98.1 F (36.7 C) (Oral)   Resp 16   Ht 5\' 7"  (1.702 m)   Wt 105.1 kg   SpO2 98%   BMI 36.29 kg/m  Physical Exam Vitals and nursing note reviewed. Exam conducted with a chaperone present.  Constitutional:      General:  She is not in acute distress.    Appearance: She is well-developed. She is not diaphoretic.  HENT:     Head: Normocephalic and atraumatic.  Pulmonary:     Effort: Pulmonary effort is normal.  Genitourinary:    Comments: Mild white discharge present, no significant abnormality.  Skin:    General: Skin is warm and dry.     Findings: No erythema or rash.  Neurological:     Mental Status: She is alert and oriented to person, place, and time.  Psychiatric:        Behavior: Behavior normal.     ED Results / Procedures / Treatments   Labs (all labs ordered are listed, but only abnormal results are displayed) Labs Reviewed  WET PREP, GENITAL - Abnormal; Notable for the following components:      Result Value   Clue Cells Wet Prep HPF POC PRESENT (*)    All other components within normal limits  GC/CHLAMYDIA PROBE AMP () NOT AT [facility]    EKG None  Radiology No results found.  Procedures Procedures    Medications Ordered in ED Medications - No data to display  ED Course/ Medical Decision Making/ A&P  Medical Decision Making Amount and/or Complexity of Data Reviewed Labs: ordered.   42 year old female with concern as above. On exam with chaperone present, is found to have mild amount of white vaginal discharge.  Her wet prep is positive for clue cells, plan is to treat bacterial vaginosis with Flagyl.  Advised not to drink alcohol while taking this medication.  Discussed with patient a more prominent bulge earlier may indicate prolapse and recommend follow-up with her gynecologist.  There is no evidence of organ prolapse or abscess at this time.        Final Clinical Impression(s) / ED Diagnoses Final diagnoses:  Bacterial vaginosis    Rx / DC Orders ED Discharge Orders          Ordered    metroNIDAZOLE (FLAGYL) 500 MG tablet  2 times daily        07/03/22 0447              Jeannie Fend, PA-C 07/03/22 1610     Sabas Sous, MD 07/03/22 [phone]

## 2022-07-03 NOTE — Discharge Instructions (Addendum)
Take Flagyl as prescribed. Do not drink alcohol while taking this medication. Follow up with your gynecologist.

## 2022-07-28 ENCOUNTER — Ambulatory Visit: Payer: Medicaid Other | Admitting: Family

## 2023-07-20 ENCOUNTER — Other Ambulatory Visit: Payer: Self-pay | Admitting: Family

## 2023-07-20 DIAGNOSIS — Z1231 Encounter for screening mammogram for malignant neoplasm of breast: Secondary | ICD-10-CM

## 2023-07-21 ENCOUNTER — Encounter: Payer: Self-pay | Admitting: Family

## 2023-08-24 ENCOUNTER — Ambulatory Visit

## 2023-09-08 ENCOUNTER — Ambulatory Visit

## 2023-09-28 ENCOUNTER — Encounter: Payer: Self-pay | Admitting: Radiology

## 2023-09-28 ENCOUNTER — Ambulatory Visit
Admission: RE | Admit: 2023-09-28 | Discharge: 2023-09-28 | Disposition: A | Discharge status: Home / Self Care | Source: Ambulatory Visit | Attending: Family | Admitting: Family

## 2023-09-28 DIAGNOSIS — Z1231 Encounter for screening mammogram for malignant neoplasm of breast: Secondary | ICD-10-CM

## 2024-03-10 ENCOUNTER — Telehealth

## 2024-03-10 NOTE — Progress Notes (Signed)
 Erroneous encounter
